# Patient Record
Sex: Female | Born: 1985
Health system: Southern US, Community
[De-identification: ages and names within clinical notes are randomized; demographics above are authoritative.]

## PROBLEM LIST (undated history)

## (undated) DIAGNOSIS — R55 Syncope and collapse: Secondary | ICD-10-CM

## (undated) DIAGNOSIS — D500 Iron deficiency anemia secondary to blood loss (chronic): Secondary | ICD-10-CM

## (undated) DIAGNOSIS — D573 Sickle-cell trait: Secondary | ICD-10-CM

## (undated) DIAGNOSIS — K581 Irritable bowel syndrome with constipation: Secondary | ICD-10-CM

## (undated) DIAGNOSIS — F419 Anxiety disorder, unspecified: Secondary | ICD-10-CM

## (undated) DIAGNOSIS — J302 Other seasonal allergic rhinitis: Secondary | ICD-10-CM

## (undated) DIAGNOSIS — G43109 Migraine with aura, not intractable, without status migrainosus: Secondary | ICD-10-CM

## (undated) DIAGNOSIS — Z973 Presence of spectacles and contact lenses: Secondary | ICD-10-CM

## (undated) DIAGNOSIS — N920 Excessive and frequent menstruation with regular cycle: Secondary | ICD-10-CM

## (undated) DIAGNOSIS — E559 Vitamin D deficiency, unspecified: Secondary | ICD-10-CM

## (undated) DIAGNOSIS — Z8669 Personal history of other diseases of the nervous system and sense organs: Secondary | ICD-10-CM

## (undated) DIAGNOSIS — Z8619 Personal history of other infectious and parasitic diseases: Secondary | ICD-10-CM

## (undated) HISTORY — DX: Personal history of other infectious and parasitic diseases: Z86.19

## (undated) HISTORY — DX: Syncope and collapse: R55

## (undated) HISTORY — DX: Personal history of other diseases of the nervous system and sense organs: Z86.69

---

## 2010-11-02 ENCOUNTER — Inpatient Hospital Stay (INDEPENDENT_AMBULATORY_CARE_PROVIDER_SITE_OTHER)
Admission: RE | Admit: 2010-11-02 | Discharge: 2010-11-02 | Disposition: A | Discharge status: Home / Self Care | Payer: Medicaid Other | Source: Ambulatory Visit

## 2010-11-02 DIAGNOSIS — H60399 Other infective otitis externa, unspecified ear: Secondary | ICD-10-CM

## 2011-10-24 ENCOUNTER — Encounter (HOSPITAL_COMMUNITY): Payer: Self-pay | Admitting: Emergency Medicine

## 2011-10-24 ENCOUNTER — Emergency Department (INDEPENDENT_AMBULATORY_CARE_PROVIDER_SITE_OTHER)
Admission: EM | Admit: 2011-10-24 | Discharge: 2011-10-24 | Disposition: A | Discharge status: Home / Self Care | Payer: Self-pay | Source: Home / Self Care | Attending: Emergency Medicine | Admitting: Emergency Medicine

## 2011-10-24 DIAGNOSIS — M62838 Other muscle spasm: Secondary | ICD-10-CM

## 2011-10-24 HISTORY — DX: Sickle-cell trait: D57.3

## 2011-10-24 MED ORDER — METAXALONE 800 MG PO TABS: 800.0000 mg | ORAL_TABLET | Freq: Three times a day (TID) | ORAL | Status: AC

## 2011-10-24 MED ORDER — HYDROCODONE-ACETAMINOPHEN 5-325 MG PO TABS: 2.0000 | ORAL_TABLET | ORAL | Status: AC | PRN

## 2011-10-24 MED ORDER — NAPROXEN 500 MG PO TABS: 500.0000 mg | ORAL_TABLET | Freq: Two times a day (BID) | ORAL | Status: DC
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
## 2011-10-24 NOTE — ED Notes (Signed)
Denies any numbness or tingling.

## 2011-10-24 NOTE — Discharge Instructions (Signed)
It can take 4-6 weeks to fully recover from something like this.Take the medication as written. Take 1 gram of tylenol  up to 4 times a day as needed for pain and fever. This with the naprosayn is an effective combination for pain. Take the hydrocodone/norco  only for severe pain. Do not take the tylenol and hydrocodone/norco as they both have tylenol in them and too much can hurt your liver. Return if you get worse, have a fever >100.4, or for any concerns.

## 2012-05-19 ENCOUNTER — Other Ambulatory Visit (HOSPITAL_COMMUNITY)
Admission: RE | Admit: 2012-05-19 | Discharge: 2012-05-19 | Disposition: A | Discharge status: Home / Self Care | Payer: Private Health Insurance - Indemnity | Source: Ambulatory Visit | Attending: Obstetrics and Gynecology | Admitting: Obstetrics and Gynecology

## 2012-05-19 DIAGNOSIS — Z01419 Encounter for gynecological examination (general) (routine) without abnormal findings: Secondary | ICD-10-CM | POA: Insufficient documentation

## 2012-05-19 DIAGNOSIS — N760 Acute vaginitis: Secondary | ICD-10-CM | POA: Insufficient documentation

## 2012-09-11 ENCOUNTER — Encounter: Payer: Self-pay | Admitting: Obstetrics & Gynecology

## 2012-09-11 ENCOUNTER — Other Ambulatory Visit: Payer: Self-pay | Admitting: Obstetrics & Gynecology

## 2012-09-11 ENCOUNTER — Ambulatory Visit (INDEPENDENT_AMBULATORY_CARE_PROVIDER_SITE_OTHER): Payer: BC Managed Care – PPO | Admitting: Obstetrics & Gynecology

## 2012-09-11 VITALS — BP 125/66 | HR 87 | Ht 66.0 in | Wt 136.0 lb

## 2012-09-11 DIAGNOSIS — Z124 Encounter for screening for malignant neoplasm of cervix: Secondary | ICD-10-CM

## 2012-09-11 DIAGNOSIS — Z01812 Encounter for preprocedural laboratory examination: Secondary | ICD-10-CM

## 2012-09-11 DIAGNOSIS — Z01419 Encounter for gynecological examination (general) (routine) without abnormal findings: Secondary | ICD-10-CM

## 2012-09-11 DIAGNOSIS — Z1151 Encounter for screening for human papillomavirus (HPV): Secondary | ICD-10-CM

## 2012-09-11 DIAGNOSIS — Z0000 Encounter for general adult medical examination without abnormal findings: Secondary | ICD-10-CM

## 2012-09-11 DIAGNOSIS — N898 Other specified noninflammatory disorders of vagina: Secondary | ICD-10-CM

## 2012-09-11 DIAGNOSIS — Z3049 Encounter for surveillance of other contraceptives: Secondary | ICD-10-CM

## 2012-09-11 DIAGNOSIS — Z309 Encounter for contraceptive management, unspecified: Secondary | ICD-10-CM

## 2012-09-11 LAB — POCT URINE PREGNANCY: Preg Test, Ur: NEGATIVE

## 2012-09-11 MED ORDER — MEDROXYPROGESTERONE ACETATE 150 MG/ML IM SUSP
150.0000 mg | INTRAMUSCULAR | Status: DC
  Administered 2012-09-11: 150 mg via INTRAMUSCULAR
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
## 2012-09-14 LAB — WET PREP, GENITAL
Trich, Wet Prep: NONE SEEN
Yeast Wet Prep HPF POC: NONE SEEN

## 2012-12-10 ENCOUNTER — Ambulatory Visit: Payer: BC Managed Care – PPO

## 2012-12-11 ENCOUNTER — Ambulatory Visit (INDEPENDENT_AMBULATORY_CARE_PROVIDER_SITE_OTHER): Payer: BC Managed Care – PPO | Admitting: *Deleted

## 2012-12-11 DIAGNOSIS — Z3042 Encounter for surveillance of injectable contraceptive: Secondary | ICD-10-CM

## 2012-12-11 DIAGNOSIS — Z3049 Encounter for surveillance of other contraceptives: Secondary | ICD-10-CM

## 2012-12-11 MED ORDER — MEDROXYPROGESTERONE ACETATE 150 MG/ML IM SUSP
150.0000 mg | Freq: Once | INTRAMUSCULAR | Status: AC
  Administered 2012-12-11: 150 mg via INTRAMUSCULAR

## 2012-12-11 MED ORDER — MEDROXYPROGESTERONE ACETATE 150 MG/ML IM SUSP: 150.0000 mg | INTRAMUSCULAR | Status: DC

## 2012-12-11 NOTE — Progress Notes (Signed)
Patient is here today for Depo provera 150mg .  It is administered in the Right upper outer quadrant of her buttock.  She is tolerating the injection well.  I have called it into the pharmacy for her for her next administration.  She will also sign up for My Chart for reminders.

## 2013-03-11 ENCOUNTER — Ambulatory Visit: Payer: BC Managed Care – PPO

## 2013-03-30 ENCOUNTER — Encounter: Payer: Self-pay | Admitting: Obstetrics & Gynecology

## 2013-03-30 ENCOUNTER — Ambulatory Visit (INDEPENDENT_AMBULATORY_CARE_PROVIDER_SITE_OTHER): Payer: BC Managed Care – PPO | Admitting: Obstetrics & Gynecology

## 2013-03-30 VITALS — BP 108/71 | HR 94 | Ht 65.0 in | Wt 126.0 lb

## 2013-03-30 DIAGNOSIS — N898 Other specified noninflammatory disorders of vagina: Secondary | ICD-10-CM

## 2013-03-30 DIAGNOSIS — N760 Acute vaginitis: Secondary | ICD-10-CM

## 2013-03-30 DIAGNOSIS — B9689 Other specified bacterial agents as the cause of diseases classified elsewhere: Secondary | ICD-10-CM

## 2013-03-30 DIAGNOSIS — A499 Bacterial infection, unspecified: Secondary | ICD-10-CM

## 2013-03-30 NOTE — Progress Notes (Signed)
GYNECOLOGY CLINIC PROGRESS NOTE  History:  27 y.o. [id number] here today for evaluation of abnormal discharge x 2 days.  Discharge is described as light brown, "almost like end of period discharge" and watery.  There is some pruritus associated with this.  She denies having any recent changes in vulvar hygiene; no recent unusual sexual activity.  She is on Depo Provera, last injection was 2 weeks ago. She reports being amenorrheic for three years.  No other GYN concerns.  The following portions of the patient's history were reviewed and updated as appropriate: allergies, current medications, past family history, past medical history, past social history, past surgical history and problem list. Last pap on 09/11/12 was normal and negative for HRHPV.  Review of Systems:  Pertinent items are noted in HPI.  Objective:  Physical Exam BP 108/71  Pulse 94  Ht 5\' 5"  (1.651 m)  Wt 126 lb (57.153 kg)  BMI 20.97 kg/m2 Gen: NAD Abd: Soft, nontender and nondistended Pelvic: Normal appearing external genitalia; normal appearing vaginal mucosa and cervix.  Small amount of light brown vaginal discharge, wet prep sample obtained.  Small uterus, no other palpable masses, no uterine or adnexal tenderness  Assessment & Plan:  Likely scant breakthrough spotting on Depo Provera, patient assured that this is normal and can occur sporadically. However, will follow up wet prep and manage accordingly.  Proper vulvar hygiene practices emphasized.

## 2013-03-30 NOTE — Patient Instructions (Signed)
Vaginitis Vaginitis is an inflammation of the vagina. It is most often caused by a change in the normal balance of the bacteria and yeast that live in the vagina. This change in balance causes an overgrowth of certain bacteria or yeast, which causes the inflammation. There are different types of vaginitis, but the most common types are:  Bacterial vaginosis.  Yeast infection (candidiasis).  Trichomoniasis vaginitis. This is a sexually transmitted infection (STI).  Viral vaginitis.  Atropic vaginitis.  Allergic vaginitis. CAUSES  The cause depends on the type of vaginitis. Vaginitis can be caused by:  Bacteria (bacterial vaginosis).  Yeast (yeast infection).  A parasite (trichomoniasis vaginitis)  A virus (viral vaginitis).  Low hormone levels (atrophic vaginitis). Low hormone levels can occur during pregnancy, breastfeeding, or after menopause.  Irritants, such as bubble baths, scented tampons, and feminine sprays (allergic vaginitis). Other factors can change the normal balance of the yeast and bacteria that live in the vagina. These include:  Antibiotic medicines.  Poor hygiene.  Diaphragms, vaginal sponges, spermicides, birth control pills, and intrauterine devices (IUD).  Sexual intercourse.  Infection.  Uncontrolled diabetes.  A weakened immune system. SYMPTOMS  Symptoms can vary depending on the cause of the vaginitis. Common symptoms include:  Abnormal vaginal discharge.  The discharge is white, gray, or yellow with bacterial vaginosis.  The discharge is thick, white, and cheesy with a yeast infection.  The discharge is frothy and yellow or greenish with trichomoniasis.  A bad vaginal odor.  The odor is fishy with bacterial vaginosis.  Vaginal itching, pain, or swelling.  Painful intercourse.  Pain or burning when urinating. Sometimes, there are no symptoms. TREATMENT  Treatment will vary depending on the type of infection.   Bacterial  vaginosis and trichomoniasis are often treated with antibiotic creams or pills.  Yeast infections are often treated with antifungal medicines, such as vaginal creams or suppositories.  Viral vaginitis has no cure, but symptoms can be treated with medicines that relieve discomfort. Your sexual partner should be treated as well.  Atrophic vaginitis may be treated with an estrogen cream, pill, suppository, or vaginal ring. If vaginal dryness occurs, lubricants and moisturizing creams may help. You may be told to avoid scented soaps, sprays, or douches.  Allergic vaginitis treatment involves quitting the use of the product that is causing the problem. Vaginal creams can be used to treat the symptoms. HOME CARE INSTRUCTIONS   Take all medicines as directed by your caregiver.  Keep your genital area clean and dry. Avoid soap and only rinse the area with water.  Avoid douching. It can remove the healthy bacteria in the vagina.  Do not use tampons or have sexual intercourse until your vaginitis has been treated. Use sanitary pads while you have vaginitis.  Wipe from front to back. This avoids the spread of bacteria from the rectum to the vagina.  Let air reach your genital area.  Wear cotton underwear to decrease moisture buildup.  Avoid wearing underwear while you sleep until your vaginitis is gone.  Avoid tight pants and underwear or nylons without a cotton panel.  Take off wet clothing (especially bathing suits) as soon as possible.  Use mild, non-scented products. Avoid using irritants, such as:  Scented feminine sprays.  Fabric softeners.  Scented detergents.  Scented tampons.  Scented soaps or bubble baths.  Practice safe sex and use condoms. Condoms may prevent the spread of trichomoniasis and viral vaginitis. SEEK MEDICAL CARE IF:   You have abdominal pain.  You   have a fever or persistent symptoms for more than 2 3 days.  You have a fever and your symptoms suddenly  get worse. Document Released: 06/16/2007 Document Revised: 05/13/2012 Document Reviewed: 01/30/2012 Exit[employer] Patient Information 2014 [employer].  

## 2013-03-31 ENCOUNTER — Encounter: Payer: Self-pay | Admitting: Obstetrics & Gynecology

## 2013-03-31 LAB — WET PREP, GENITAL
Clue Cells Wet Prep HPF POC: NONE SEEN
Yeast Wet Prep HPF POC: NONE SEEN

## 2013-03-31 MED ORDER — TINIDAZOLE 500 MG PO TABS: 2.0000 g | ORAL_TABLET | Freq: Every day | ORAL | Status: DC

## 2013-03-31 NOTE — Addendum Note (Signed)
Addended by: Jaynie Collins A on: 03/31/2013 09:28 AM   Modules accepted: Orders

## 2013-04-20 ENCOUNTER — Encounter (HOSPITAL_COMMUNITY): Payer: Self-pay | Admitting: *Deleted

## 2013-04-20 ENCOUNTER — Emergency Department (INDEPENDENT_AMBULATORY_CARE_PROVIDER_SITE_OTHER)
Admission: EM | Admit: 2013-04-20 | Discharge: 2013-04-20 | Disposition: A | Discharge status: Home / Self Care | Payer: BC Managed Care – PPO | Source: Home / Self Care | Attending: Family Medicine | Admitting: Family Medicine

## 2013-04-20 ENCOUNTER — Emergency Department (INDEPENDENT_AMBULATORY_CARE_PROVIDER_SITE_OTHER): Payer: BC Managed Care – PPO

## 2013-04-20 DIAGNOSIS — R0789 Other chest pain: Secondary | ICD-10-CM

## 2013-04-20 MED ORDER — DICLOFENAC POTASSIUM 50 MG PO TABS: 50.0000 mg | ORAL_TABLET | Freq: Three times a day (TID) | ORAL | Status: DC

## 2013-04-20 NOTE — ED Notes (Signed)
Pt  Reports  She  Developed  r  Upper  Chest  Wall pain   Worse  When  She takes  A  Deep  Breath and  On  Palpation as  Well        Sitting  Upright on  Exam table  Speaking in  Complete  sentances   No PE  Risk  Factors

## 2013-04-20 NOTE — ED Provider Notes (Addendum)
CSN: [id number]     Arrival date & time 04/20/13  1031 History     None    Chief Complaint  Patient presents with  . Chest Pain   (Consider location/radiation/quality/duration/timing/severity/associated sxs/prior Treatment) Patient is a 27 y.o. female presenting with chest pain. The history is provided by the patient.  Chest Pain Pain location:  R chest Pain quality: sharp   Pain radiates to:  Does not radiate Pain radiates to the back: no   Pain severity:  Mild Onset quality:  Sudden Progression:  Unchanged Chronicity:  New Context: movement and raising an arm   Relieved by:  None tried Worsened by:  Nothing tried Associated symptoms: no palpitations and no shortness of breath     Past Medical History  Diagnosis Date  . Asthma   . Sickle cell trait    History reviewed. No pertinent past surgical history. Family History  Problem Relation Age of Onset  . Hypertension Mother   . Asthma Mother   . Hypertension Father   . Asthma Father   . Asthma Son   . Hypertension Maternal Grandmother   . Hypertension Maternal Grandfather   . Hypertension Paternal Grandmother   . Hypertension Paternal Grandfather    History  Substance Use Topics  . Smoking status: Never Smoker   . Smokeless tobacco: Never Used  . Alcohol Use: No   OB History   Grav Para Term Preterm Abortions TAB SAB Ect Mult Living   3 1 1  0 2 1 1  0 0 1     Review of Systems  Constitutional: Negative.   Respiratory: Negative for chest tightness, shortness of breath and wheezing.   Cardiovascular: Positive for chest pain. Negative for palpitations and leg swelling.  Gastrointestinal: Negative.     Allergies  Review of patient's allergies indicates no known allergies.  Home Medications   Current Outpatient Rx  Name  Route  Sig  Dispense  Refill  . diclofenac (CATAFLAM) 50 MG tablet   Oral   Take 1 tablet (50 mg total) by mouth 3 (three) times daily.   21 tablet   0   . medroxyPROGESTERone  (DEPO-PROVERA) 150 MG/ML injection   Intramuscular   Inject 1 mL (150 mg total) into the muscle every 3 (three) months.   1 mL   5   . tinidazole (TINDAMAX) 500 MG tablet   Oral   Take 4 tablets (2,000 mg total) by mouth daily with breakfast. For two days   8 tablet   2    BP 119/91  Pulse 93  Temp(Src) 97.7 F (36.5 C) (Oral)  Resp 16  SpO2 100% Physical Exam  Nursing note and vitals reviewed. Constitutional: She is oriented to person, place, and time. She appears well-developed and well-nourished.  HENT:  Mouth/Throat: Oropharynx is clear and moist.  Neck: Normal range of motion. Neck supple.  Cardiovascular: Normal rate, regular rhythm, normal heart sounds and intact distal pulses.   Pulmonary/Chest: Effort normal and breath sounds normal. She exhibits tenderness.  Abdominal: Soft. Bowel sounds are normal.  Musculoskeletal: She exhibits no edema.  Lymphadenopathy:    She has no cervical adenopathy.  Neurological: She is alert and oriented to person, place, and time.  Skin: Skin is warm and dry.    ED Course   Procedures (including critical care time)  Labs Reviewed - No data to display Dg Chest 2 View  04/20/2013   *RADIOLOGY REPORT*  Clinical Data: Chest pain.  CHEST - 2 VIEW  Comparison: None.  Findings: Trachea is midline.  Heart size normal.  Lungs are clear. No pleural fluid.  IMPRESSION: Negative.   Original Report Authenticated By: Leanna Battles, M.D.   1. Musculoskeletal chest pain     MDM  X-rays reviewed and report per radiologist.   Linna Hoff, MD 04/20/13 1124  Linna Hoff, MD 04/20/13 [phone]

## 2013-07-05 ENCOUNTER — Ambulatory Visit (INDEPENDENT_AMBULATORY_CARE_PROVIDER_SITE_OTHER): Payer: BC Managed Care – PPO | Admitting: Family Medicine

## 2013-07-05 ENCOUNTER — Encounter: Payer: Self-pay | Admitting: Family Medicine

## 2013-07-05 VITALS — BP 102/64 | HR 96 | Temp 98.2°F | Ht 65.25 in | Wt 118.8 lb

## 2013-07-05 DIAGNOSIS — G43909 Migraine, unspecified, not intractable, without status migrainosus: Secondary | ICD-10-CM

## 2013-07-05 DIAGNOSIS — D573 Sickle-cell trait: Secondary | ICD-10-CM

## 2013-07-05 DIAGNOSIS — D649 Anemia, unspecified: Secondary | ICD-10-CM

## 2013-07-05 DIAGNOSIS — Z8701 Personal history of pneumonia (recurrent): Secondary | ICD-10-CM

## 2013-07-05 DIAGNOSIS — G4700 Insomnia, unspecified: Secondary | ICD-10-CM

## 2013-07-05 NOTE — Assessment & Plan Note (Signed)
Recommend pneumovax Pt wants to put this off for a week or 2- will f/u for nurse appt or get at [facility] [employer] where she works

## 2013-07-05 NOTE — Progress Notes (Signed)
Subjective:    Patient ID: Anna Kidd, female    DOB: 1986/04/22, 27 y.o.   MRN: [id number]  HPI Here to establish for regular care   Pt of Dr Marice Potter (gyn) Last visit 1/14 with a nl pap She gets depo provera injections -and does not have menses with it   imms Td-? 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 What her baseline hemoglobin  Was on iron in the past - most of her life- not now - and she hates it   Hx of migraines-ongoing problem - for about 3 years  ? If corresponds with depo provera - it could ? She gets a headache at least twice per week  Works through it - she has pretty bad pain (wants to lie down in a dark room) No n/v , pos for sensitivity to light/ sound and smell  No otc meds work  occ goes to sleep and then wakes up with the same headache - can be 2 hours to 2 days  Just had her depo shot in oct- due in jan  Migraines do not run in the family  2 caff soda per day  5 hours of sleep per night - occ wakes up tired  She is a very light sleeper   She is sexually active   Has fainted in the past- when she was pregnant -- (got so anemic she needed blood tranfusions)   Has had pneumonia in 2004- was hospitalized 1 1/2 months - it was severe  Had to have blood transfusions ICU for a week before she went upstairs  Does have asthma now -since then  Not a smoker   Has not had vaccine for pneumonia   Does not drink or smoke  Has one child- son is almost 7 Is single   There are no active problems to display for this patient.  Past Medical History  Diagnosis Date  .  Asthma   . Sickle cell trait   . History of chicken pox   . Fainting spell     only when pregnant  . History of migraine    No past surgical history on file. History  Substance Use Topics  . Smoking status: Never Smoker   . Smokeless tobacco: Never Used  . Alcohol Use: No   Family History  Problem Relation Age of Onset  . Hypertension Mother   . Asthma Mother   . Hypertension Father   . Asthma Father   . Asthma Son   . Hypertension Maternal Grandmother   . Hypertension Maternal Grandfather   . Hypertension Paternal Grandmother   . Hypertension Paternal Grandfather   . Diabetes Paternal Grandmother    No Known Allergies Current Outpatient Prescriptions on File Prior to Visit  Medication Sig Dispense Refill  . medroxyPROGESTERone (DEPO-PROVERA) 150 MG/ML injection Inject 1 mL (150 mg total) into the muscle every 3 (three) months.  1 mL  5   No current facility-administered medications on file prior  to visit.     Review of Systems    Review of Systems  Constitutional: Negative for fever, appetite change, and unexpected weight change. pos for occasional fatigue and poor sleep Eyes: Negative for pain and visual disturbance.  Respiratory: Negative for cough and shortness of breath.   Cardiovascular: Negative for cp or palpitations    Gastrointestinal: Negative for nausea, diarrhea and constipation.  Genitourinary: Negative for urgency and frequency.  Skin: Negative for pallor or rash   Neurological: Negative for weakness, light-headedness, numbness and pos for headaches  Hematological: Negative for adenopathy. Does not bruise/bleed easily.  Psychiatric/Behavioral: Negative for dysphoric mood. The patient is not nervous/anxious.      Objective:   Physical Exam  Constitutional: She appears well-developed and well-nourished. No distress.  Slim and well appearing  HENT:  Head: Normocephalic and atraumatic.  Right Ear: External ear normal.  Left Ear: External ear normal.   Nose: Nose normal.  Mouth/Throat: Oropharynx is clear and moist.  Eyes: Conjunctivae and EOM are normal. Pupils are equal, round, and reactive to light. Right eye exhibits no discharge. Left eye exhibits no discharge. No scleral icterus.  Neck: Normal range of motion. Neck supple. No JVD present. Carotid bruit is not present. No thyromegaly present.  Cardiovascular: Normal rate, regular rhythm, normal heart sounds and intact distal pulses.  Exam reveals no gallop.   No murmur heard. Pulmonary/Chest: Effort normal and breath sounds normal. No respiratory distress. She has no wheezes.  Abdominal: Soft. Bowel sounds are normal. She exhibits no distension, no abdominal bruit and no mass. There is no tenderness.  Musculoskeletal: She exhibits no edema and no tenderness.  Lymphadenopathy:    She has no cervical adenopathy.  Neurological: She is alert. She has normal reflexes. She displays no atrophy and no tremor. No cranial nerve deficit or sensory deficit. She exhibits normal muscle tone. Coordination and gait normal.  Skin: Skin is warm and dry. No rash noted. No erythema. No pallor.  Psychiatric: She has a normal mood and affect.          Assessment & Plan:

## 2013-07-05 NOTE — Assessment & Plan Note (Signed)
Suspect that this may be a side eff of her depo provera She will disc this with her gyn physician when due for next dose Rev migraine prevention= given handout Disc lifestyle/sleephabits/ caffeine/ stressors and hydration (also avoidance of analgesic rebound) If no imp off depo- may consider ha prophylaxis

## 2013-07-05 NOTE — Assessment & Plan Note (Signed)
Cbc today Hx of anemia Not currently on iron

## 2013-07-05 NOTE — Assessment & Plan Note (Signed)
Disc sleep hygiene and need for better sleep in light of migraine hx tsh today

## 2013-07-05 NOTE — Patient Instructions (Signed)
Check into the HPV vaccine coverage with your insurance - and let us know if you want to start the series You need tetanus shot (Tdap) and also a pneumococcal vaccine  Labs today for thyroid function and also blood count (for anemia )  Try to eat a healthy diet/ more water (and try to cut out caffeine) Also try to get more sleep  Ask Dr Marice Potter before your next depo shot re: is the depo causing your migraines - you may need a trial without it just so see

## 2013-07-05 NOTE — Assessment & Plan Note (Signed)
Lab today Hx of sickle cell trait  Used to be on iron

## 2013-07-06 LAB — CBC WITH DIFFERENTIAL/PLATELET
Basophils Absolute: 0 10*3/uL (ref 0.0–0.1)
Basophils Relative: 0.3 % (ref 0.0–3.0)
Eosinophils Absolute: 0.2 10*3/uL (ref 0.0–0.7)
Eosinophils Relative: 3.2 % (ref 0.0–5.0)
HCT: 35.9 % — ABNORMAL LOW (ref 36.0–46.0)
Hemoglobin: 11.6 g/dL — ABNORMAL LOW (ref 12.0–15.0)
Lymphocytes Relative: 49.3 % — ABNORMAL HIGH (ref 12.0–46.0)
Lymphs Abs: 3 10*3/uL (ref 0.7–4.0)
MCHC: 32.5 g/dL (ref 30.0–36.0)
MCV: 71.6 fl — ABNORMAL LOW (ref 78.0–100.0)
Monocytes Absolute: 0.4 10*3/uL (ref 0.1–1.0)
Monocytes Relative: 6.2 % (ref 3.0–12.0)
Neutro Abs: 2.5 10*3/uL (ref 1.4–7.7)
Neutrophils Relative %: 41 % — ABNORMAL LOW (ref 43.0–77.0)
Platelets: 253 10*3/uL (ref 150.0–400.0)
RBC: 5.01 Mil/uL (ref 3.87–5.11)
RDW: 14.2 % (ref 11.5–14.6)
WBC: 6.1 10*3/uL (ref 4.5–10.5)

## 2013-07-06 LAB — TSH: TSH: 0.74 u[IU]/mL (ref 0.35–5.50)

## 2013-07-06 LAB — FERRITIN: Ferritin: 28.9 ng/mL (ref 10.0–291.0)

## 2013-08-19 ENCOUNTER — Emergency Department (INDEPENDENT_AMBULATORY_CARE_PROVIDER_SITE_OTHER)
Admission: EM | Admit: 2013-08-19 | Discharge: 2013-08-19 | Disposition: A | Discharge status: Home / Self Care | Payer: BC Managed Care – PPO | Source: Home / Self Care

## 2013-08-19 ENCOUNTER — Encounter (HOSPITAL_COMMUNITY): Payer: Self-pay | Admitting: Emergency Medicine

## 2013-08-19 DIAGNOSIS — M940 Chondrocostal junction syndrome [Tietze]: Secondary | ICD-10-CM

## 2013-08-19 NOTE — ED Notes (Signed)
Pt c/o constant chest pain onset 0400... Pain woke her up and she started to vomit Pain increases when she lays on left side or when she takes deep breaths... sxs also include feeling weak Denies: f/d, SOB, wheezing.... Reports she was lifting heavy boxes at work ranging 15-25lbs  She is alert and talking in complete sentences w/no signs of acute distress.

## 2013-08-19 NOTE — ED Provider Notes (Signed)
CSN: [id number]     Arrival date & time 08/19/13  1331 History   First MD Initiated Contact with Patient 08/19/13 1404     Chief Complaint  Patient presents with  . Chest Pain   (Consider location/radiation/quality/duration/timing/severity/associated sxs/prior Treatment) HPI Comments: 27 year old female presents with left parasternal chest pain since early this morning. Pain is located at the left sternal border. It is described as a sharp  ephemoral pain. She states that 2 days ago she was unloading a truck and performing a lot of lifting maneuvers. It also hurts to take a deep breath and sometimes with movement. Denies shortness of breath, cough.   Past Medical History  Diagnosis Date  . Asthma   . Sickle cell trait   . History of chicken pox   . Fainting spell     only when pregnant  . History of migraine    History reviewed. No pertinent past surgical history. Family History  Problem Relation Age of Onset  . Hypertension Mother   . Asthma Mother   . Hypertension Father   . Asthma Father   . Asthma Son   . Hypertension Maternal Grandmother   . Hypertension Maternal Grandfather   . Hypertension Paternal Grandmother   . Hypertension Paternal Grandfather   . Diabetes Paternal Grandmother    History  Substance Use Topics  . Smoking status: Never Smoker   . Smokeless tobacco: Never Used  . Alcohol Use: No   OB History   Grav Para Term Preterm Abortions TAB SAB Ect Mult Living   3 1 1  0 2 1 1  0 0 1     Review of Systems  Constitutional: Negative for fever, chills and activity change.  HENT: Negative.   Respiratory: Negative.  Negative for cough, choking, chest tightness, shortness of breath and wheezing.   Cardiovascular: Positive for chest pain.  Gastrointestinal: Negative.   Musculoskeletal:       As per HPI  Skin: Negative for color change, pallor and rash.  Neurological: Negative.     Allergies  Review of patient's allergies indicates no known  allergies.  Home Medications   Current Outpatient Rx  Name  Route  Sig  Dispense  Refill  . medroxyPROGESTERone (DEPO-PROVERA) 150 MG/ML injection   Intramuscular   Inject 1 mL (150 mg total) into the muscle every 3 (three) months.   1 mL   5    BP 107/81  Pulse 85  Temp(Src) 97.1 F (36.2 C) (Oral)  Resp 18  SpO2 100% Physical Exam  Nursing note and vitals reviewed. Constitutional: She is oriented to person, place, and time. She appears well-developed and well-nourished. No distress.  HENT:  Head: Normocephalic and atraumatic.  Eyes: Conjunctivae and EOM are normal.  Neck: Normal range of motion. Neck supple.  Cardiovascular: Normal rate, regular rhythm, normal heart sounds and intact distal pulses.   No murmur heard. Pulmonary/Chest: Effort normal and breath sounds normal. No respiratory distress. She has no wheezes. She has no rales.  Abdominal: Soft. There is no tenderness.  Musculoskeletal: She exhibits no edema.  Neurological: She is alert and oriented to person, place, and time. No cranial nerve deficit.  Skin: Skin is warm and dry.  Psychiatric: She has a normal mood and affect.    ED Course  Procedures (including critical care time) Labs Review Labs Reviewed - No data to display Imaging Review No results found.      MDM   1. Costochondritis, acute  NSAIDS   Ibuprofen 600mg  q 6h prn  Ice Instrucitons for Dx. Limit activity that produces pain  For cough or dyspnea rechk.  Hayden Rasmussen, NP 08/19/13 [id number]

## 2013-08-21 NOTE — ED Provider Notes (Signed)
Medical screening examination/treatment/procedure(s) were performed by a resident physician or non-physician practitioner and as the supervising physician I was immediately available for consultation/collaboration.  Clementeen Graham, MD    Rodolph Bong, MD 08/21/13 [phone]

## 2013-09-14 ENCOUNTER — Encounter: Payer: Self-pay | Admitting: Obstetrics & Gynecology

## 2013-09-14 ENCOUNTER — Ambulatory Visit (INDEPENDENT_AMBULATORY_CARE_PROVIDER_SITE_OTHER): Payer: BC Managed Care – PPO | Admitting: Obstetrics & Gynecology

## 2013-09-14 VITALS — BP 107/69 | HR 88 | Ht 65.0 in | Wt 122.0 lb

## 2013-09-14 DIAGNOSIS — Z309 Encounter for contraceptive management, unspecified: Secondary | ICD-10-CM

## 2013-09-14 DIAGNOSIS — G43909 Migraine, unspecified, not intractable, without status migrainosus: Secondary | ICD-10-CM

## 2013-09-14 MED ORDER — SUMATRIPTAN SUCCINATE 100 MG PO TABS: 100.0000 mg | ORAL_TABLET | Freq: Once | ORAL | Status: DC | PRN

## 2013-09-14 NOTE — Patient Instructions (Signed)
Migraine Headache A migraine headache is an intense, throbbing pain on one or both sides of your head. A migraine can last for 30 minutes to several hours. CAUSES  The exact cause of a migraine headache is not always known. However, a migraine may be caused when nerves in the brain become irritated and release chemicals that cause inflammation. This causes pain. Certain things may also trigger migraines, such as:  Alcohol.  Smoking.  Stress.  Menstruation.  Aged cheeses.  Foods or drinks that contain nitrates, glutamate, aspartame, or tyramine.  Lack of sleep.  Chocolate.  Caffeine.  Hunger.  Physical exertion.  Fatigue.  Medicines used to treat chest pain (nitroglycerine), birth control pills, estrogen, and some blood pressure medicines. SIGNS AND SYMPTOMS  Pain on one or both sides of your head.  Pulsating or throbbing pain.  Severe pain that prevents daily activities.  Pain that is aggravated by any physical activity.  Nausea, vomiting, or both.  Dizziness.  Pain with exposure to bright lights, loud noises, or activity.  General sensitivity to bright lights, loud noises, or smells. Before you get a migraine, you may get warning signs that a migraine is coming (aura). An aura may include:  Seeing flashing lights.  Seeing bright spots, halos, or zig-zag lines.  Having tunnel vision or blurred vision.  Having feelings of numbness or tingling.  Having trouble talking.  Having muscle weakness. DIAGNOSIS  A migraine headache is often diagnosed based on:  Symptoms.  Physical exam.  A CT scan or MRI of your head. These imaging tests cannot diagnose migraines, but they can help rule out other causes of headaches. TREATMENT Medicines may be given for pain and nausea. Medicines can also be given to help prevent recurrent migraines.  HOME CARE INSTRUCTIONS  Only take over-the-counter or prescription medicines for pain or discomfort as directed by your  health care provider. The use of long-term narcotics is not recommended.  Lie down in a dark, quiet room when you have a migraine.  Keep a journal to find out what may trigger your migraine headaches. For example, write down:  What you eat and drink.  How much sleep you get.  Any change to your diet or medicines.  Limit alcohol consumption.  Quit smoking if you smoke.  Get 7 9 hours of sleep, or as recommended by your health care provider.  Limit stress.  Keep lights dim if bright lights bother you and make your migraines worse. SEEK IMMEDIATE MEDICAL CARE IF:   Your migraine becomes severe.  You have a fever.  You have a stiff neck.  You have vision loss.  You have muscular weakness or loss of muscle control.  You start losing your balance or have trouble walking.  You feel faint or pass out.  You have severe symptoms that are different from your first symptoms. MAKE SURE YOU:   Understand these instructions.  Will watch your condition.  Will get help right away if you are not doing well or get worse. Document Released: 08/19/2005 Document Revised: 06/09/2013 Document Reviewed: 04/26/2013 [employer] Patient Information 2014 [employer].  

## 2013-09-14 NOTE — Progress Notes (Signed)
   CLINIC ENCOUNTER NOTE  History:  28 y.o. [id number] here today for discussion of contraception. She is on Depo Provera but gets severe migraines about 3 times a week.  She had exacerbation of her migraines with pregnancy and believes the migraines are hormone-related.  Last dose was 06/16/13, she declines any further doses for now and wants to discuss other options.  No other concerns.  The following portions of the patient's history were reviewed and updated as appropriate: allergies, current medications, past family history, past medical history, past social history, past surgical history and problem list.  Normal pap and negative HPV in 09/11/12.  Review of Systems:  Pertinent items are noted in HPI.  Objective:   BP 107/69  Pulse 88  Ht 5\' 5"  (1.651 m)  Wt 122 lb (55.339 kg)  BMI 20.30 kg/m2 Physical Exam deferred  Assessment & Plan:  Reviewed all forms of birth control options available including abstinence; over the counter/barrier methods; hormonal contraceptive medication including pill, patch, ring, Depo-Provera injection, Nexplanon; Mirena, Skyla and Paragard IUDs.   Risks and benefits reviewed.  Questions were answered.   Patient wants to avoid periods as she has heavy bleeding requiring transfusions in the past; she was told Paragard was not a good choice but she can consider continuous OCPs, Nuvaring or Mirena.  She wants to avoid all hormones for now, will try condoms and consider Mirena IUD. Bleeding precautions reviewed.   Sumatriptan pills also prescribed for patient; this was done after consultation with Jannifer RodneyLinda Barefoot, NP, Headache Specialist.  Patient was told to return for IUD insertion or call back if she decides on another modality.  She was also encouraged to meet with Jannifer RodneyLinda Barefoot, NP for further evaluation of her migraines.    Jaynie CollinsUGONNA  Tanish Prien, MD, FACOG Attending Obstetrician & Gynecologist Faculty Practice, [facility]

## 2013-12-20 ENCOUNTER — Encounter: Payer: Self-pay | Admitting: Family Medicine

## 2013-12-20 ENCOUNTER — Ambulatory Visit (INDEPENDENT_AMBULATORY_CARE_PROVIDER_SITE_OTHER): Payer: BC Managed Care – PPO | Admitting: Family Medicine

## 2013-12-20 VITALS — BP 106/68 | HR 90 | Temp 98.1°F | Ht 65.0 in | Wt 126.8 lb

## 2013-12-20 DIAGNOSIS — G4700 Insomnia, unspecified: Secondary | ICD-10-CM

## 2013-12-20 DIAGNOSIS — G43909 Migraine, unspecified, not intractable, without status migrainosus: Secondary | ICD-10-CM

## 2013-12-20 MED ORDER — AMITRIPTYLINE HCL 10 MG PO TABS: 30.0000 mg | ORAL_TABLET | Freq: Every day | ORAL | Status: DC

## 2013-12-20 NOTE — Assessment & Plan Note (Signed)
Disc sleep hygeine-overall good No mood disorder Has migraines Trial of amitriptyline 10 mg -titrate to 30 if needed at bedtime  Disc poss side eff- if mood disorder will let me know  F/u planned  Handout given also

## 2013-12-20 NOTE — Progress Notes (Signed)
Pre visit review using our clinic review tool, if applicable. No additional management support is needed unless otherwise documented below in the visit note. 

## 2013-12-20 NOTE — Patient Instructions (Signed)
Try amitriptyline for sleep and headache prevention Try 1 pill (10 mg) at bedtime and then you can gradually increase it to 30 mg if you need to -however when you get to the dose that helps sleep without too much am sedation stop there  If side effects - let me know  If you get depressed- stop it and let me know  Follow up with me in 6-8 weeks     Insomnia Insomnia is frequent trouble falling and/or staying asleep. Insomnia can be a long term problem or a short term problem. Both are common. Insomnia can be a short term problem when the wakefulness is related to a certain stress or worry. Long term insomnia is often related to ongoing stress during waking hours and/or poor sleeping habits. Overtime, sleep deprivation itself can make the problem worse. Every little thing feels more severe because you are overtired and your ability to cope is decreased. CAUSES   Stress, anxiety, and depression.  Poor sleeping habits.  Distractions such as TV in the bedroom.  Naps close to bedtime.  Engaging in emotionally charged conversations before bed.  Technical reading before sleep.  Alcohol and other sedatives. They may make the problem worse. They can hurt normal sleep patterns and normal dream activity.  Stimulants such as caffeine for several hours prior to bedtime.  Pain syndromes and shortness of breath can cause insomnia.  Exercise late at night.  Changing time zones may cause sleeping problems (jet lag). It is sometimes helpful to have someone observe your sleeping patterns. They should look for periods of not breathing during the night (sleep apnea). They should also look to see how long those periods last. If you live alone or observers are uncertain, you can also be observed at a sleep clinic where your sleep patterns will be professionally monitored. Sleep apnea requires a checkup and treatment. Give your caregivers your medical history. Give your caregivers observations your family  has made about your sleep.  SYMPTOMS   Not feeling rested in the morning.  Anxiety and restlessness at bedtime.  Difficulty falling and staying asleep. TREATMENT   Your caregiver may prescribe treatment for an underlying medical disorders. Your caregiver can give advice or help if you are using alcohol or other drugs for self-medication. Treatment of underlying problems will usually eliminate insomnia problems.  Medications can be prescribed for short time use. They are generally not recommended for lengthy use.  Over-the-counter sleep medicines are not recommended for lengthy use. They can be habit forming.  You can promote easier sleeping by making lifestyle changes such as:  Using relaxation techniques that help with breathing and reduce muscle tension.  Exercising earlier in the day.  Changing your diet and the time of your last meal. No night time snacks.  Establish a regular time to go to bed.  Counseling can help with stressful problems and worry.  Soothing music and white noise may be helpful if there are background noises you cannot remove.  Stop tedious detailed work at least one hour before bedtime. HOME CARE INSTRUCTIONS   Keep a diary. Inform your caregiver about your progress. This includes any medication side effects. See your caregiver regularly. Take note of:  Times when you are asleep.  Times when you are awake during the night.  The quality of your sleep.  How you feel the next day. This information will help your caregiver care for you.  Get out of bed if you are still awake after 15 minutes.  Read or do some quiet activity. Keep the lights down. Wait until you feel sleepy and go back to bed.  Keep regular sleeping and waking hours. Avoid naps.  Exercise regularly.  Avoid distractions at bedtime. Distractions include watching television or engaging in any intense or detailed activity like attempting to balance the household checkbook.  Develop a  bedtime ritual. Keep a familiar routine of bathing, brushing your teeth, climbing into bed at the same time each night, listening to soothing music. Routines increase the success of falling to sleep faster.  Use relaxation techniques. This can be using breathing and muscle tension release routines. It can also include visualizing peaceful scenes. You can also help control troubling or intruding thoughts by keeping your mind occupied with boring or repetitive thoughts like the old concept of counting sheep. You can make it more creative like imagining planting one beautiful flower after another in your backyard garden.  During your day, work to eliminate stress. When this is not possible use some of the previous suggestions to help reduce the anxiety that accompanies stressful situations. MAKE SURE YOU:   Understand these instructions.  Will watch your condition.  Will get help right away if you are not doing well or get worse. Document Released: 08/16/2000 Document Revised: 11/11/2011 Document Reviewed: 09/16/2007 [facility] Patient Information 2014 [employer] [place].

## 2013-12-20 NOTE — Assessment & Plan Note (Signed)
Improved off depo shot  Pt considering minena IUD - will probably go forward with that for contraception  Px amitriptyline for sleep- this will hopefully further help headaches

## 2013-12-20 NOTE — Progress Notes (Signed)
Subjective:    Patient ID: Anna Kidd, female    DOB: 1986/07/09, 28 y.o.   MRN: [id number]  HPI Here for insomnia  She stopped her depo and she stopped her headache  She was considering an IUD   Her headaches are much milder now -and not as often   Having a lot of trouble sleeping  She falls asleep but wakes back up "a lot" - and sometime cannot go back to sleep at all  She has tried melatonin  She tried zquil (antihistamine)- it helps some but she still wakes up (gets a good 2 hours of deep sleep)   She does not drink caffeine  Mood has been good  Does not exercise right before bed Very active job physically   No trouble sleeping in the past - never paid much attention   Patient Active Problem List   Diagnosis Date Noted  . Contraception management 09/14/2013  . Migraine 07/05/2013  . Insomnia 07/05/2013  . Sickle cell trait 07/05/2013  . Anemia 07/05/2013  . History of pneumonia 07/05/2013   Past Medical History  Diagnosis Date  . Asthma   . Sickle cell trait   . History of chicken pox   . Fainting spell     only when pregnant  . History of migraine    No past surgical history on file. History  Substance Use Topics  . Smoking status: Never Smoker   . Smokeless tobacco: Never Used  . Alcohol Use: No   Family History  Problem Relation Age of Onset  . Hypertension Mother   . Asthma Mother   . Hypertension Father   . Asthma Father   . Asthma Son   . Hypertension Maternal Grandmother   . Hypertension Maternal Grandfather   . Hypertension Paternal Grandmother   . Hypertension Paternal Grandfather   . Diabetes Paternal Grandmother    No Known Allergies Current Outpatient Prescriptions on File Prior to Visit  Medication Sig Dispense Refill  . SUMAtriptan (IMITREX) 100 MG tablet Take 1 tablet (100 mg total) by mouth once as needed for migraine or headache. May repeat in 2 hours if headache persists or recurs.  9 tablet  11   No current  facility-administered medications on file prior to visit.     Review of Systems Review of Systems  Constitutional: Negative for fever, appetite change,  and unexpected weight change. pos for fatigue  Eyes: Negative for pain and visual disturbance.  Respiratory: Negative for cough and shortness of breath.   Cardiovascular: Negative for cp or palpitations    Gastrointestinal: Negative for nausea, diarrhea and constipation.  Genitourinary: Negative for urgency and frequency.  Skin: Negative for pallor or rash   Neurological: Negative for weakness, light-headedness, numbness and pos for headaches that are improved   Hematological: Negative for adenopathy. Does not bruise/bleed easily.  Psychiatric/Behavioral: Negative for dysphoric mood. The patient is not nervous/anxious.         Objective:   Physical Exam  Constitutional: She appears well-developed and well-nourished. No distress.  Slim and well app  HENT:  Head: Normocephalic and atraumatic.  Mouth/Throat: Oropharynx is clear and moist.  Eyes: Conjunctivae and EOM are normal. Pupils are equal, round, and reactive to light. No scleral icterus.  Neck: Normal range of motion. Neck supple. No JVD present. Carotid bruit is not present. No thyromegaly present.  Cardiovascular: Normal rate and regular rhythm.   No murmur heard. Pulmonary/Chest: Effort normal and breath sounds normal.  Musculoskeletal: She exhibits  no edema.  Lymphadenopathy:    She has no cervical adenopathy.  Neurological: She is alert. She has normal reflexes. She displays no atrophy. No cranial nerve deficit or sensory deficit. She exhibits normal muscle tone. Coordination and gait normal.  Skin: Skin is warm and dry. No rash noted. No pallor.  Psychiatric: She has a normal mood and affect.          Assessment & Plan:

## 2014-03-07 ENCOUNTER — Other Ambulatory Visit (INDEPENDENT_AMBULATORY_CARE_PROVIDER_SITE_OTHER): Payer: Self-pay | Admitting: *Deleted

## 2014-03-07 DIAGNOSIS — Z01812 Encounter for preprocedural laboratory examination: Secondary | ICD-10-CM

## 2014-03-07 DIAGNOSIS — Z3049 Encounter for surveillance of other contraceptives: Secondary | ICD-10-CM

## 2014-03-07 DIAGNOSIS — Z3042 Encounter for surveillance of injectable contraceptive: Secondary | ICD-10-CM

## 2014-03-07 LAB — POCT URINE PREGNANCY: Preg Test, Ur: NEGATIVE

## 2014-03-07 MED ORDER — MEDROXYPROGESTERONE ACETATE 150 MG/ML IM SUSP: 150.0000 mg | INTRAMUSCULAR | Status: DC

## 2014-03-07 MED ORDER — MEDROXYPROGESTERONE ACETATE 150 MG/ML IM SUSP
150.0000 mg | Freq: Once | INTRAMUSCULAR | Status: AC
  Administered 2014-03-07: 150 mg via INTRAMUSCULAR

## 2014-03-07 NOTE — Progress Notes (Signed)
Pt here today for a Depo Provera injection.  UPT negative today in office.

## 2014-07-04 ENCOUNTER — Encounter: Payer: Self-pay | Admitting: Family Medicine

## 2015-04-12 IMAGING — CR DG CHEST 2V
2 series · 2 of 2 positions shown · non-contrast
Comparison: None.

CLINICAL DATA: Chest pain.

CHEST - 2 VIEW

[view not recorded (1 of 2)]
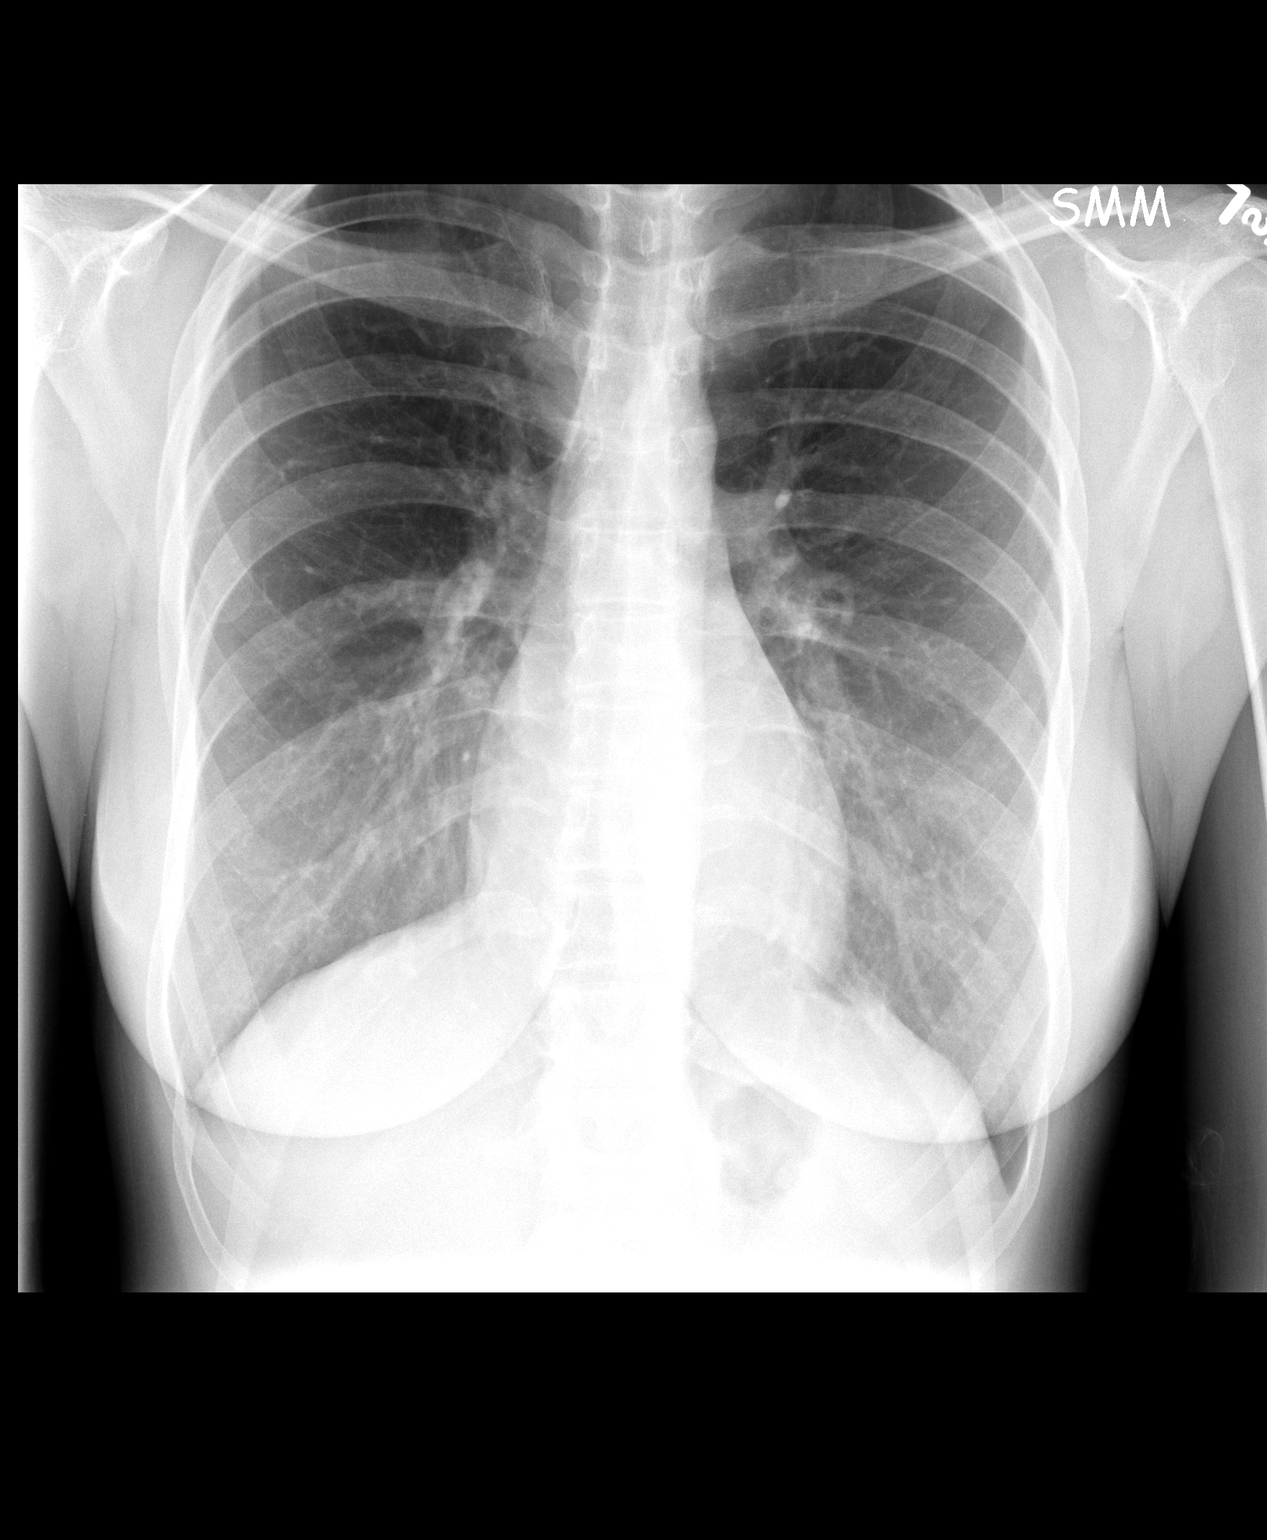

[view not recorded (2 of 2)]
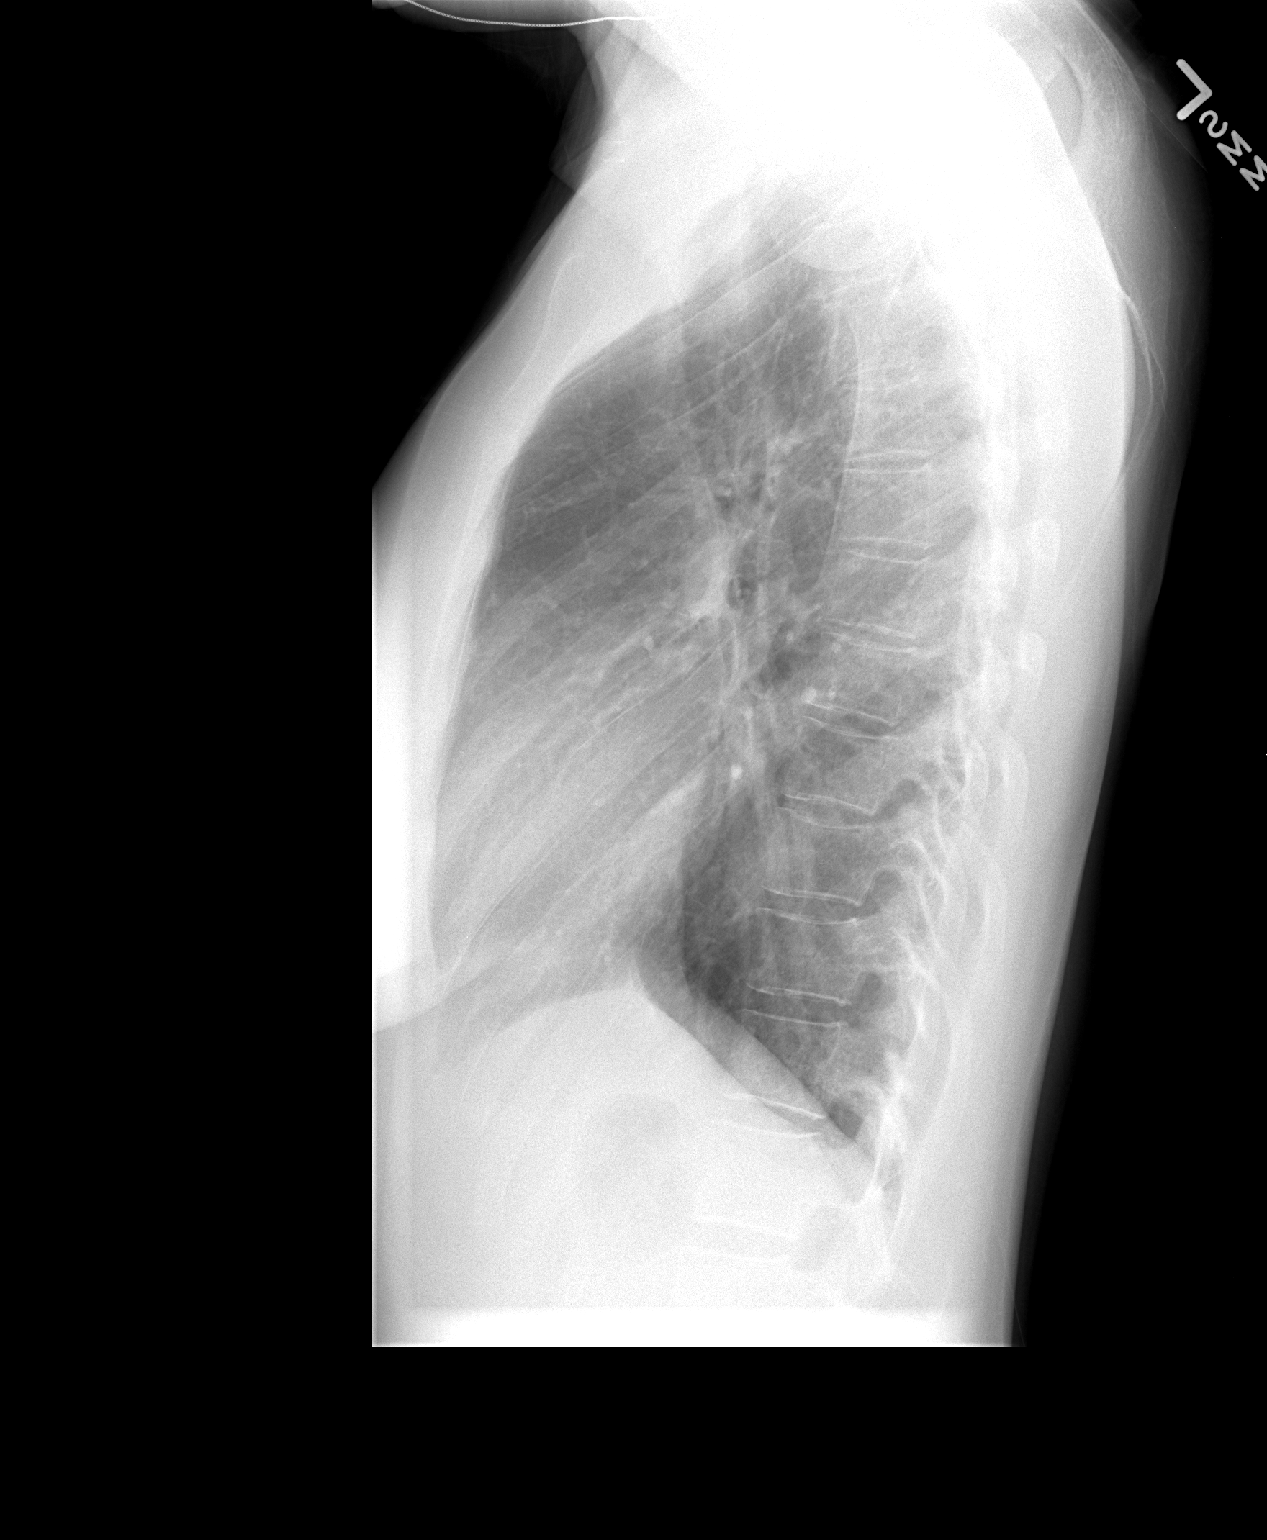

[2 of 2 positions shown; findings below may reference images not displayed]

FINDINGS: Trachea is midline.  Heart size normal.  Lungs are clear.
No pleural fluid.
IMPRESSION: Negative.

## 2015-10-04 ENCOUNTER — Other Ambulatory Visit: Payer: Self-pay | Admitting: Obstetrics & Gynecology

## 2015-10-04 ENCOUNTER — Other Ambulatory Visit (HOSPITAL_COMMUNITY)
Admission: RE | Admit: 2015-10-04 | Discharge: 2015-10-04 | Disposition: A | Discharge status: Home / Self Care | Payer: BLUE CROSS/BLUE SHIELD | Source: Ambulatory Visit | Attending: Obstetrics & Gynecology | Admitting: Obstetrics & Gynecology

## 2015-10-04 DIAGNOSIS — Z01411 Encounter for gynecological examination (general) (routine) with abnormal findings: Secondary | ICD-10-CM | POA: Diagnosis present

## 2015-10-04 DIAGNOSIS — Z113 Encounter for screening for infections with a predominantly sexual mode of transmission: Secondary | ICD-10-CM | POA: Insufficient documentation

## 2015-10-04 DIAGNOSIS — Z1151 Encounter for screening for human papillomavirus (HPV): Secondary | ICD-10-CM | POA: Insufficient documentation

## 2015-10-04 DIAGNOSIS — R87810 Cervical high risk human papillomavirus (HPV) DNA test positive: Secondary | ICD-10-CM | POA: Diagnosis present

## 2015-10-05 LAB — CYTOLOGY - PAP

## 2015-11-06 ENCOUNTER — Other Ambulatory Visit: Payer: Self-pay | Admitting: Obstetrics & Gynecology

## 2016-03-28 ENCOUNTER — Ambulatory Visit: Payer: BLUE CROSS/BLUE SHIELD | Admitting: Family Medicine

## 2016-12-05 ENCOUNTER — Other Ambulatory Visit (HOSPITAL_COMMUNITY)
Admission: RE | Admit: 2016-12-05 | Discharge: 2016-12-05 | Disposition: A | Discharge status: Home / Self Care | Payer: BLUE CROSS/BLUE SHIELD | Source: Ambulatory Visit | Attending: Obstetrics & Gynecology | Admitting: Obstetrics & Gynecology

## 2016-12-05 ENCOUNTER — Other Ambulatory Visit: Payer: Self-pay | Admitting: Obstetrics & Gynecology

## 2016-12-05 DIAGNOSIS — Z01419 Encounter for gynecological examination (general) (routine) without abnormal findings: Secondary | ICD-10-CM | POA: Insufficient documentation

## 2016-12-05 DIAGNOSIS — Z1151 Encounter for screening for human papillomavirus (HPV): Secondary | ICD-10-CM | POA: Insufficient documentation

## 2016-12-05 DIAGNOSIS — R87810 Cervical high risk human papillomavirus (HPV) DNA test positive: Secondary | ICD-10-CM | POA: Insufficient documentation

## 2016-12-11 LAB — CYTOLOGY - PAP
Diagnosis: NEGATIVE
HPV 16/18/45 genotyping: NEGATIVE
HPV: DETECTED — AB

## 2019-09-09 ENCOUNTER — Ambulatory Visit: Payer: No Typology Code available for payment source | Attending: Internal Medicine

## 2019-09-09 DIAGNOSIS — Z20822 Contact with and (suspected) exposure to covid-19: Secondary | ICD-10-CM

## 2019-09-11 LAB — NOVEL CORONAVIRUS, NAA: SARS-CoV-2, NAA: NOT DETECTED

## 2020-07-07 ENCOUNTER — Other Ambulatory Visit: Payer: Self-pay

## 2020-07-07 DIAGNOSIS — Z111 Encounter for screening for respiratory tuberculosis: Secondary | ICD-10-CM | POA: Diagnosis not present

## 2020-07-07 DIAGNOSIS — Z23 Encounter for immunization: Secondary | ICD-10-CM | POA: Diagnosis not present

## 2020-07-10 DIAGNOSIS — Z111 Encounter for screening for respiratory tuberculosis: Secondary | ICD-10-CM | POA: Diagnosis not present

## 2020-07-10 DIAGNOSIS — Z021 Encounter for pre-employment examination: Secondary | ICD-10-CM | POA: Diagnosis not present

## 2020-07-18 ENCOUNTER — Ambulatory Visit: Payer: No Typology Code available for payment source | Admitting: Family Medicine

## 2020-07-26 ENCOUNTER — Other Ambulatory Visit (HOSPITAL_COMMUNITY): Payer: Self-pay | Admitting: Obstetrics and Gynecology

## 2020-07-26 DIAGNOSIS — Z3202 Encounter for pregnancy test, result negative: Secondary | ICD-10-CM | POA: Diagnosis not present

## 2020-07-26 DIAGNOSIS — Z3042 Encounter for surveillance of injectable contraceptive: Secondary | ICD-10-CM | POA: Diagnosis not present

## 2021-01-08 ENCOUNTER — Other Ambulatory Visit: Payer: Self-pay

## 2021-01-08 ENCOUNTER — Ambulatory Visit
Admission: EM | Admit: 2021-01-08 | Discharge: 2021-01-08 | Disposition: A | Discharge status: Home / Self Care | Payer: BC Managed Care – PPO | Attending: Emergency Medicine | Admitting: Emergency Medicine

## 2021-01-08 DIAGNOSIS — Z20822 Contact with and (suspected) exposure to covid-19: Secondary | ICD-10-CM | POA: Diagnosis not present

## 2021-01-08 DIAGNOSIS — J069 Acute upper respiratory infection, unspecified: Secondary | ICD-10-CM | POA: Diagnosis not present

## 2021-01-08 MED ORDER — PREDNISONE 20 MG PO TABS: 40.0000 mg | ORAL_TABLET | Freq: Every day | ORAL | 0 refills | Status: AC

## 2021-01-08 MED ORDER — BENZONATATE 200 MG PO CAPS: 200.0000 mg | ORAL_CAPSULE | Freq: Three times a day (TID) | ORAL | 0 refills | Status: AC | PRN

## 2021-01-08 MED ORDER — FLUTICASONE PROPIONATE 50 MCG/ACT NA SUSP: 1.0000 | Freq: Every day | NASAL | 0 refills | Status: DC

## 2021-01-08 NOTE — ED Provider Notes (Signed)
[facility]    CSN: [id number] Arrival date & time: 01/08/21  0932      History   Chief Complaint Chief Complaint  Patient presents with  . Cough    HPI Rushie Brazel is a 35 y.o. female presenting today for evaluation of URI symptoms.  Reports associated cough, sore throat headache and fatigue beginning 2 days ago.  Reports associated sinus pressure.  Denies known fevers.  Reports at home COVID test negative.   HPI  Past Medical History:  Diagnosis Date  . Asthma   . Fainting spell    only when pregnant  . History of chicken pox   . History of migraine   . Sickle cell trait [facility])     Patient Active Problem List   Diagnosis Date Noted  . Contraception management 09/14/2013  . Migraine 07/05/2013  . Insomnia 07/05/2013  . Sickle cell trait (HCC) 07/05/2013  . Anemia 07/05/2013  . History of pneumonia 07/05/2013    History reviewed. No pertinent surgical history.  OB History    Gravida  3   Para  1   Term  1   Preterm  0   AB  2   Living  1     SAB  1   IAB  1   Ectopic  0   Multiple  0   Live Births               Home Medications    Prior to Admission medications   Medication Sig Start Date End Date Taking? Authorizing Provider  benzonatate (TESSALON) 200 MG capsule Take 1 capsule (200 mg total) by mouth 3 (three) times daily as needed for up to 7 days for cough. 01/08/21 01/15/21 Yes Briauna Gilmartin C, PA-C  fluticasone (FLONASE) 50 MCG/ACT nasal spray Place 1-2 sprays into both nostrils daily. 01/08/21  Yes Malcom Selmer C, PA-C  predniSONE (DELTASONE) 20 MG tablet Take 2 tablets (40 mg total) by mouth daily with breakfast for 5 days. 01/08/21 01/13/21 Yes Nanna Ertle C, PA-C  amitriptyline (ELAVIL) 10 MG tablet Take 3 tablets (30 mg total) by mouth at bedtime. 12/20/13   Tower, Audrie Gallus, MD  medroxyPROGESTERone (DEPO-PROVERA) 150 MG/ML injection INJECT ONCE EVERY 3 MONTHS 07/26/20 07/26/21  Steva Ready, DO  SUMAtriptan  (IMITREX) 100 MG tablet Take 1 tablet (100 mg total) by mouth once as needed for migraine or headache. May repeat in 2 hours if headache persists or recurs. 09/14/13   Tereso Newcomer, MD    Family History Family History  Problem Relation Age of Onset  . Hypertension Mother   . Asthma Mother   . Hypertension Father   . Asthma Father   . Asthma Son   . Hypertension Maternal Grandmother   . Hypertension Maternal Grandfather   . Hypertension Paternal Grandmother   . Hypertension Paternal Grandfather   . Diabetes Paternal Grandmother     Social History Social History   Tobacco Use  . Smoking status: Never Smoker  . Smokeless tobacco: Never Used  Substance Use Topics  . Alcohol use: No  . Drug use: No     Allergies   Patient has no known allergies.   Review of Systems Review of Systems  Constitutional: Negative for activity change, appetite change, chills, fatigue and fever.  HENT: Positive for congestion, rhinorrhea, sinus pressure and sore throat. Negative for ear pain and trouble swallowing.   Eyes: Negative for discharge and redness.  Respiratory: Positive for cough. Negative  for chest tightness and shortness of breath.   Cardiovascular: Negative for chest pain.  Gastrointestinal: Negative for abdominal pain, diarrhea, nausea and vomiting.  Musculoskeletal: Negative for myalgias.  Skin: Negative for rash.  Neurological: Negative for dizziness, light-headedness and headaches.     Physical Exam Triage Vital Signs ED Triage Vitals  Enc Vitals Group     BP 01/08/21 1057 122/86     Pulse Rate 01/08/21 1057 (!) 104     Resp 01/08/21 1057 18     Temp 01/08/21 1057 98.4 F (36.9 C)     Temp Source 01/08/21 1057 Oral     SpO2 01/08/21 1057 99 %     Weight --      Height --      Head Circumference --      Peak Flow --      Pain Score 01/08/21 1058 6     Pain Loc --      Pain Edu? --      Excl. in [place]? --    No data found.  Updated Vital Signs BP 122/86 (BP  Location: Left Arm)   Pulse (!) 104   Temp 98.4 F (36.9 C) (Oral)   Resp 18   SpO2 99%   Visual Acuity Right Eye Distance:   Left Eye Distance:   Bilateral Distance:    Right Eye Near:   Left Eye Near:    Bilateral Near:     Physical Exam Vitals and nursing note reviewed.  Constitutional:      Appearance: She is well-developed.     Comments: No acute distress  HENT:     Head: Normocephalic and atraumatic.     Ears:     Comments: Bilateral ears without tenderness to palpation of external auricle, tragus and mastoid, EAC's without erythema or swelling, TM's with good bony landmarks and cone of light. Non erythematous. Bilateral large clear effusions without erythema    Nose: Nose normal.     Mouth/Throat:     Comments: Oral mucosa pink and moist, no tonsillar enlargement or exudate. Posterior pharynx patent and nonerythematous, no uvula deviation or swelling. Normal phonation. Eyes:     Conjunctiva/sclera: Conjunctivae normal.  Cardiovascular:     Rate and Rhythm: Normal rate.  Pulmonary:     Effort: Pulmonary effort is normal. No respiratory distress.     Comments: Breathing comfortably at rest, CTABL, no wheezing, rales or other adventitious sounds auscultated Abdominal:     General: There is no distension.  Musculoskeletal:        General: Normal range of motion.     Cervical back: Neck supple.  Skin:    General: Skin is warm and dry.  Neurological:     Mental Status: She is alert and oriented to person, place, and time.      UC Treatments / Results  Labs (all labs ordered are listed, but only abnormal results are displayed) Labs Reviewed  NOVEL CORONAVIRUS, NAA    EKG   Radiology No results found.  Procedures Procedures (including critical care time)  Medications Ordered in UC Medications - No data to display  Initial Impression / Assessment and Plan / UC Course  I have reviewed the triage vital signs and the nursing notes.  Pertinent labs &  imaging results that were available during my care of the patient were reviewed by me and considered in my medical decision making (see chart for details).     Viral URI with cough-COVID test pending, recommending  continued symptomatic and supportive care, placing on course of prednisone to help with sinus pressure/eustachian tube dysfunction, rest and fluids, continue to monitor,Discussed strict return precautions. Patient verbalized understanding and is agreeable with plan.  Final Clinical Impressions(s) / UC Diagnoses   Final diagnoses:  Encounter for screening laboratory testing for COVID-19 virus  Viral URI with cough     Discharge Instructions     COVID test pending, monitor MyChart for results Rest and fluids Tylenol and ibuprofen as needed for any headaches, body aches, fevers Continue Claritin, add in Flonase nasal spray Tessalon every 8 hours for cough or may use other over-the-counter cough medicine such as Robitussin, Delsym or Dimetapp Prednisone 40 mg daily to help with sinus pressure/fluid on ears Follow-up if not improving or worsening    ED Prescriptions    Medication Sig Dispense Auth. Provider   benzonatate (TESSALON) 200 MG capsule Take 1 capsule (200 mg total) by mouth 3 (three) times daily as needed for up to 7 days for cough. 28 capsule Annastasia Haskins C, PA-C   fluticasone (FLONASE) 50 MCG/ACT nasal spray Place 1-2 sprays into both nostrils daily. 16 g Deema Juncaj C, PA-C   predniSONE (DELTASONE) 20 MG tablet Take 2 tablets (40 mg total) by mouth daily with breakfast for 5 days. 10 tablet Makaylen Thieme, [place] C, PA-C     PDMP not reviewed this encounter.   Lew Dawes, [place] 01/08/21 1129

## 2021-01-08 NOTE — ED Triage Notes (Signed)
Pt c/o cough, sore throat, headache, and fatigue since Saturday. States had a neg home covid test on Saturday.

## 2021-01-08 NOTE — Discharge Instructions (Signed)
COVID test pending, monitor MyChart for results Rest and fluids Tylenol and ibuprofen as needed for any headaches, body aches, fevers Continue Claritin, add in Flonase nasal spray Tessalon every 8 hours for cough or may use other over-the-counter cough medicine such as Robitussin, Delsym or Dimetapp Prednisone 40 mg daily to help with sinus pressure/fluid on ears Follow-up if not improving or worsening

## 2021-01-09 LAB — SARS-COV-2, NAA 2 DAY TAT

## 2021-01-09 LAB — NOVEL CORONAVIRUS, NAA: SARS-CoV-2, NAA: NOT DETECTED

## 2021-01-26 ENCOUNTER — Other Ambulatory Visit (HOSPITAL_COMMUNITY): Payer: Self-pay

## 2021-01-26 MED FILL — Medroxyprogesterone Acetate IM Susp 150 MG/ML: INTRAMUSCULAR | 90 days supply | Qty: 1 | Fill #0 | Status: AC

## 2021-04-13 ENCOUNTER — Other Ambulatory Visit (HOSPITAL_COMMUNITY): Payer: Self-pay

## 2021-04-13 MED ORDER — MEDROXYPROGESTERONE ACETATE 150 MG/ML IM SUSY
150.0000 mg | PREFILLED_SYRINGE | INTRAMUSCULAR | 1 refills | Status: DC
  Filled 2021-04-13: qty 1, 90d supply, fill #0
  Filled 2021-07-10: qty 1, 90d supply, fill #1

## 2021-07-10 ENCOUNTER — Other Ambulatory Visit (HOSPITAL_COMMUNITY): Payer: Self-pay

## 2021-09-26 ENCOUNTER — Other Ambulatory Visit (HOSPITAL_COMMUNITY): Payer: Self-pay

## 2021-09-27 ENCOUNTER — Other Ambulatory Visit (HOSPITAL_COMMUNITY): Payer: Self-pay

## 2021-09-27 MED ORDER — MEDROXYPROGESTERONE ACETATE 150 MG/ML IM SUSY
150.0000 mg | PREFILLED_SYRINGE | INTRAMUSCULAR | 1 refills | Status: DC
  Filled 2021-09-27: qty 1, 90d supply, fill #0
  Filled 2021-12-13 – 2022-01-09 (×2): qty 1, 90d supply, fill #1

## 2021-12-13 ENCOUNTER — Other Ambulatory Visit (HOSPITAL_COMMUNITY): Payer: Self-pay

## 2021-12-21 ENCOUNTER — Other Ambulatory Visit (HOSPITAL_COMMUNITY): Payer: Self-pay

## 2022-01-09 ENCOUNTER — Other Ambulatory Visit (HOSPITAL_COMMUNITY): Payer: Self-pay

## 2022-07-04 ENCOUNTER — Other Ambulatory Visit: Payer: Self-pay

## 2022-07-04 MED ORDER — MEDROXYPROGESTERONE ACETATE 150 MG/ML IM SUSY
150.0000 mg | PREFILLED_SYRINGE | INTRAMUSCULAR | 0 refills | Status: DC
  Filled 2022-07-04: qty 1, 90d supply, fill #0

## 2022-07-05 ENCOUNTER — Other Ambulatory Visit: Payer: Self-pay

## 2022-09-17 ENCOUNTER — Ambulatory Visit
Admission: EM | Admit: 2022-09-17 | Discharge: 2022-09-17 | Disposition: A | Discharge status: Other Acute Inpatient Hospital | Payer: BC Managed Care – PPO

## 2022-09-17 ENCOUNTER — Other Ambulatory Visit: Payer: Self-pay

## 2022-09-17 ENCOUNTER — Emergency Department
Admission: EM | Admit: 2022-09-17 | Discharge: 2022-09-17 | Disposition: A | Discharge status: Home / Self Care | Payer: BC Managed Care – PPO | Attending: Emergency Medicine | Admitting: Emergency Medicine

## 2022-09-17 ENCOUNTER — Emergency Department: Payer: BC Managed Care – PPO

## 2022-09-17 DIAGNOSIS — J45909 Unspecified asthma, uncomplicated: Secondary | ICD-10-CM | POA: Diagnosis not present

## 2022-09-17 DIAGNOSIS — Z1152 Encounter for screening for COVID-19: Secondary | ICD-10-CM | POA: Insufficient documentation

## 2022-09-17 DIAGNOSIS — R109 Unspecified abdominal pain: Secondary | ICD-10-CM | POA: Insufficient documentation

## 2022-09-17 LAB — HEPATIC FUNCTION PANEL
ALT: 22 U/L (ref 0–44)
AST: 24 U/L (ref 15–41)
Albumin: 4.1 g/dL (ref 3.5–5.0)
Alkaline Phosphatase: 54 U/L (ref 38–126)
Bilirubin, Direct: 0.1 mg/dL (ref 0.0–0.2)
Indirect Bilirubin: 1 mg/dL — ABNORMAL HIGH (ref 0.3–0.9)
Total Bilirubin: 1.1 mg/dL (ref 0.3–1.2)
Total Protein: 7.2 g/dL (ref 6.5–8.1)

## 2022-09-17 LAB — CBC
HCT: 37.7 % (ref 36.0–46.0)
Hemoglobin: 12.2 g/dL (ref 12.0–15.0)
MCH: 22.6 pg — ABNORMAL LOW (ref 26.0–34.0)
MCHC: 32.4 g/dL (ref 30.0–36.0)
MCV: 69.8 fL — ABNORMAL LOW (ref 80.0–100.0)
Platelets: 279 10*3/uL (ref 150–400)
RBC: 5.4 MIL/uL — ABNORMAL HIGH (ref 3.87–5.11)
RDW: 14.1 % (ref 11.5–15.5)
WBC: 4.9 10*3/uL (ref 4.0–10.5)
nRBC: 0 % (ref 0.0–0.2)

## 2022-09-17 LAB — RESP PANEL BY RT-PCR (RSV, FLU A&B, COVID)  RVPGX2
Influenza A by PCR: NEGATIVE
Influenza B by PCR: NEGATIVE
Resp Syncytial Virus by PCR: NEGATIVE
SARS Coronavirus 2 by RT PCR: NEGATIVE

## 2022-09-17 LAB — BASIC METABOLIC PANEL
Anion gap: 7 (ref 5–15)
BUN: 11 mg/dL (ref 6–20)
CO2: 22 mmol/L (ref 22–32)
Calcium: 9.1 mg/dL (ref 8.9–10.3)
Chloride: 106 mmol/L (ref 98–111)
Creatinine, Ser: 0.82 mg/dL (ref 0.44–1.00)
GFR, Estimated: >60 mL/min (ref >60)
Glucose, Bld: 92 mg/dL (ref 70–99)
Potassium: 3.7 mmol/L (ref 3.5–5.1)
Sodium: 135 mmol/L (ref 135–145)

## 2022-09-17 LAB — POC URINE PREG, ED: Preg Test, Ur: NEGATIVE

## 2022-09-17 LAB — TROPONIN I (HIGH SENSITIVITY): Troponin I (High Sensitivity): <2 ng/L (ref <18)

## 2022-09-17 LAB — LIPASE, BLOOD: Lipase: 38 U/L (ref 11–51)

## 2022-09-17 MED ORDER — ACETAMINOPHEN 500 MG PO TABS
1000.0000 mg | ORAL_TABLET | Freq: Once | ORAL | Status: AC
  Administered 2022-09-17: 1000 mg via ORAL
  Filled 2022-09-17: qty 2

## 2022-09-17 MED ORDER — KETOROLAC TROMETHAMINE 30 MG/ML IJ SOLN
30.0000 mg | Freq: Once | INTRAMUSCULAR | Status: AC
  Administered 2022-09-17: 30 mg via INTRAMUSCULAR
  Filled 2022-09-17: qty 1

## 2022-09-17 NOTE — ED Provider Notes (Signed)
[facility] Provider Note    Event Date/Time   First MD Initiated Contact with Patient 09/17/22 [phone]     (approximate)   History   Chest Pain (Left side )   HPI  Anna Kidd is a 37 y.o. female   Past medical history of migraines, asthma who presents emergency department with left-sided flank pain starting this morning.  Yesterday was uneventful, no injuries, no recent illnesses.  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  Comes and goes.  No urinary symptoms.  Bowel movements have been normal.  No bleeding.    No history of kidney stones.   Pain is worse with movement palpation and deep breath. Without any interventions pain is much improved at this time during evaluation the emergency department.  History was obtained via the patient.      Physical Exam   Triage Vital Signs: ED Triage Vitals  Enc Vitals Group     BP 09/17/22 0834 112/80     Pulse Rate 09/17/22 0834 78     Resp 09/17/22 0834 18     Temp 09/17/22 0834 97.9 F (36.6 C)     Temp Source 09/17/22 0834 Oral     SpO2 09/17/22 0834 98 %     Weight 09/17/22 0835 175 lb (79.4 kg)     Height 09/17/22 0835 5\' 5"  (1.651 m)     Head Circumference --      Peak Flow --      Pain Score 09/17/22 0831 6     Pain Loc --      Pain Edu? --      Excl. in [place]? --     Most recent vital signs: Vitals:   09/17/22 0834  BP: 112/80  Pulse: 78  Resp: 18  Temp: 97.9 F (36.6 C)  SpO2: 98%    General: Awake, no distress.  CV:  Good peripheral perfusion.  Resp:  Normal effort.  Abd:  No distention.  Other:  Mild CVA tenderness left-sided.  Lungs clear no wheezing or focality.  Abdomen is soft and nontender deep palpation all quadrants.   ED Results / Procedures / Treatments   Labs (all labs ordered are listed, but only abnormal results are displayed) Labs Reviewed  CBC - Abnormal; Notable for the following components:       Result Value   RBC 5.40 (*)    MCV 69.8 (*)    MCH 22.6 (*)    All other components within normal limits  HEPATIC FUNCTION PANEL - Abnormal; Notable for the following components:   Indirect Bilirubin 1.0 (*)    All other components within normal limits  RESP PANEL BY RT-PCR (RSV, FLU A&B, COVID)  RVPGX2  BASIC METABOLIC PANEL  LIPASE, BLOOD  POC URINE PREG, ED  TROPONIN I (HIGH SENSITIVITY)     I reviewed labs and they are notable for normal creatinine 0.8, normal white blood cell count of 4.9.  Hemoglobin is 12.2.  EKG  ED ECG REPORT I, Lucillie Garfinkel, the attending physician, personally viewed and interpreted this ECG.   Date: 09/17/2022  EKG Time: 0833  Rate: 90  Rhythm: normal sinus rhythm  Axis: nl  Intervals:none  ST&T Change: No acute ischemic changes    RADIOLOGY I independently reviewed and interpreted chest x-ray see no obvious focalities or pneumothorax   PROCEDURES:  Critical Care performed: No  Procedures   MEDICATIONS ORDERED IN ED: Medications  ketorolac (TORADOL) 30 MG/ML injection 30 mg (30 mg Intramuscular Given 09/17/22 1016)  acetaminophen (TYLENOL) tablet 1,000 mg (1,000 mg Oral Given 09/17/22 1016)    Consultants:   IMPRESSION / MDM / [place] / ED COURSE  I reviewed the triage vital signs and the nursing notes.                              Differential diagnosis includes, but is not limited to, strain, UTI or Pyelonephritis, renal colic or kidney stone, respiratory infection, asthma exacerbation, ACS, PE,   MDM: This is a patient with acute onset intermittent sharp left flank pain concerning for renal colic.  No history of the same.  Pain already much improved without intervention.  Very few cardiac risk factors no current chest pain doubt ACS.  Nonischemic EKG.  Negative initial troponin. Doubt PE given normal hemodynamics, very low clinical suspicion given incongruent symptoms.  Check urinalysis, CT flank.   CT abdomen  pelvis with no acute abnormalities.  Considered admission for observation, serial abdominal exams but since the patient symptoms well-controlled normal hemodynamics and negative workup as above, I think outpatient management is appropriate at this time.  Pain control, PMD follow-up and strict return precautions given.  Patient's presentation is most consistent with acute presentation with potential threat to life or bodily function.       FINAL CLINICAL IMPRESSION(S) / ED DIAGNOSES   Final diagnoses:  Left flank pain     Rx / DC Orders   ED Discharge Orders     None        Note:  This document was prepared using Dragon voice recognition software and may include unintentional dictation errors.    Lucillie Garfinkel, MD 09/17/22 1059

## 2022-09-17 NOTE — ED Notes (Signed)
See triage note  Presents with some pain to left side of chest /lateral rib area  States sxs' started this am   No fever or cough

## 2022-09-17 NOTE — Discharge Instructions (Signed)
Take acetaminophen 650 mg and ibuprofen 400 mg every 6 hours for pain.  Take with food.  Thank you for choosing us for your health care today!  Please see your primary doctor this week for a follow up appointment.   Sometimes, in the early stages of certain disease courses it is difficult to detect in the emergency department evaluation -- so, it is important that you continue to monitor your symptoms and call your doctor right away or return to the emergency department if you develop any new or worsening symptoms.  Please go to the following website to schedule new (and existing) patient appointments:   [email or web address]  If you do not have a primary doctor try calling the following clinics to establish care:  If you have insurance:  [facility] [phone] [address] [place] [address]   [facility]  [phone] [address] [place] [address] [place] [address]   If you do not have insurance:  [facility]  [phone] [address] [place] [address]   The following is another list of primary care offices in the area who are accepting new patients at this time.  Please reach out to one of them directly and let them know you would like to schedule an appointment to follow up on an Emergency Department visit, and/or to establish a new primary care provider (PCP).  There are likely other primary care clinics in the are who are accepting new patients, but this is an excellent place to start:  [facility] Lead physician: Dr Angela Bacigalupo [address] [place], [place] [address] [phone]  [facility] Lead Physician: Dr Krichna Sowles [address], [place], [place] [address] [phone]  [facility]  Lead Physician: Dr Megan Johnson [address], [place], [place] [address] [phone]  [facility] [place] [facility] Lead Physician: Dr Alex  Karamalegos [address], [place], [place] [address] [phone]  [facility] [place] Lead Physician: Dr Laura Berglund [address], [place], [place] [address] [phone]   It was my pleasure to care for you today.   Librado Guandique S. Ruddy Swire, MD  

## 2022-09-17 NOTE — ED Triage Notes (Addendum)
Pt to ED via POV from home. Pt ambulatory to triage. Pt reports left sided rib pain and SOB that started this morning. Pt denies N/V/D. Pt denies cardiac hx or reflux. Pt reports hx sickle cell trait.

## 2022-10-11 ENCOUNTER — Ambulatory Visit
Admission: EM | Admit: 2022-10-11 | Discharge: 2022-10-11 | Disposition: A | Discharge status: Home / Self Care | Payer: BC Managed Care – PPO | Attending: Emergency Medicine | Admitting: Emergency Medicine

## 2022-10-11 ENCOUNTER — Encounter: Payer: Self-pay | Admitting: Emergency Medicine

## 2022-10-11 DIAGNOSIS — Z1152 Encounter for screening for COVID-19: Secondary | ICD-10-CM | POA: Insufficient documentation

## 2022-10-11 DIAGNOSIS — R197 Diarrhea, unspecified: Secondary | ICD-10-CM | POA: Diagnosis not present

## 2022-10-11 DIAGNOSIS — J45909 Unspecified asthma, uncomplicated: Secondary | ICD-10-CM | POA: Insufficient documentation

## 2022-10-11 DIAGNOSIS — R1110 Vomiting, unspecified: Secondary | ICD-10-CM | POA: Diagnosis not present

## 2022-10-11 DIAGNOSIS — B9789 Other viral agents as the cause of diseases classified elsewhere: Secondary | ICD-10-CM | POA: Insufficient documentation

## 2022-10-11 DIAGNOSIS — J029 Acute pharyngitis, unspecified: Secondary | ICD-10-CM

## 2022-10-11 DIAGNOSIS — J028 Acute pharyngitis due to other specified organisms: Secondary | ICD-10-CM | POA: Diagnosis not present

## 2022-10-11 DIAGNOSIS — R21 Rash and other nonspecific skin eruption: Secondary | ICD-10-CM | POA: Insufficient documentation

## 2022-10-11 DIAGNOSIS — D573 Sickle-cell trait: Secondary | ICD-10-CM | POA: Diagnosis not present

## 2022-10-11 LAB — POCT RAPID STREP A (OFFICE): Rapid Strep A Screen: NEGATIVE

## 2022-10-11 NOTE — Discharge Instructions (Addendum)
Your strep test is negative.  Your COVID test is pending.    Take Tylenol as needed for fever or discomfort.  Rest and keep yourself hydrated.    Follow-up with your primary care provider if your symptoms are not improving.

## 2022-10-11 NOTE — ED Provider Notes (Signed)
[facility]    CSN: [id number] Arrival date & time: 10/11/22  1505      History   Chief Complaint No chief complaint on file.   HPI Laneah Dettorre is a 37 y.o. female.  Patient presents with sore throat today.  Fever, chills, rash, cough, shortness of breath, vomiting, diarrhea, or other symptoms.  No OTC medications taken today.  Patient was seen at [facility] ED on 09/17/2022; diagnosed with left flank pain.  Her medical history includes asthma, migraine headache, sickle cell trait.  She works at an [employer].   The history is provided by the patient and medical records.    Past Medical History:  Diagnosis Date   Asthma    Fainting spell    only when pregnant   History of chicken pox    History of migraine    Sickle cell trait [facility])     Patient Active Problem List   Diagnosis Date Noted   Contraception management 09/14/2013   Migraine 07/05/2013   Insomnia 07/05/2013   Sickle cell trait ([place]) 07/05/2013   Anemia 07/05/2013   History of pneumonia 07/05/2013    History reviewed. No pertinent surgical history.  OB History     Gravida  3   Para  1   Term  1   Preterm  0   AB  2   Living  1      SAB  1   IAB  1   Ectopic  0   Multiple  0   Live Births               Home Medications    Prior to Admission medications   Medication Sig Start Date End Date Taking? Authorizing Provider  LINZESS 145 MCG CAPS capsule PLEASE SEE ATTACHED FOR DETAILED DIRECTIONS 06/26/22  Yes [provider]  amitriptyline (ELAVIL) 10 MG tablet Take 3 tablets (30 mg total) by mouth at bedtime. 12/20/13   Tower, Wynelle Fanny, MD  fluticasone (FLONASE) 50 MCG/ACT nasal spray Place 1-2 sprays into both nostrils daily. 01/08/21   Wieters, Hallie C, PA-C  medroxyPROGESTERone Acetate 150 MG/ML SUSY Inject 1 mL (150 mg total) once every 3 (three) months. 09/27/21     medroxyPROGESTERone Acetate 150 MG/ML SUSY Inject 1 mL (150 mg total) into the muscle every 3  (three) months. 07/04/22     SUMAtriptan (IMITREX) 100 MG tablet Take 1 tablet (100 mg total) by mouth once as needed for migraine or headache. May repeat in 2 hours if headache persists or recurs. 09/14/13   Osborne Oman, MD    Family History Family History  Problem Relation Age of Onset   Hypertension Mother    Asthma Mother    Hypertension Father    Asthma Father    Asthma Son    Hypertension Maternal Grandmother    Hypertension Maternal Grandfather    Hypertension Paternal Grandmother    Hypertension Paternal Grandfather    Diabetes Paternal Grandmother     Social History Social History   Tobacco Use   Smoking status: Never   Smokeless tobacco: Never  Substance Use Topics   Alcohol use: No   Drug use: No     Allergies   Patient has no known allergies.   Review of Systems Review of Systems  Constitutional:  Negative for chills and fever.  HENT:  Positive for sore throat. Negative for ear pain.   Respiratory:  Negative for cough and shortness of breath.  Cardiovascular:  Negative for chest pain and palpitations.  Gastrointestinal:  Negative for diarrhea and vomiting.  Skin:  Negative for color change and rash.  All other systems reviewed and are negative.    Physical Exam Triage Vital Signs ED Triage Vitals  Enc Vitals Group     BP      Pulse      Resp      Temp      Temp src      SpO2      Weight      Height      Head Circumference      Peak Flow      Pain Score      Pain Loc      Pain Edu?      Excl. in [place]?    No data found.  Updated Vital Signs BP 117/83   Pulse 90   Temp 98.2 F (36.8 C)   Resp 18   SpO2 98%   Visual Acuity Right Eye Distance:   Left Eye Distance:   Bilateral Distance:    Right Eye Near:   Left Eye Near:    Bilateral Near:     Physical Exam Vitals and nursing note reviewed.  Constitutional:      General: She is not in acute distress.    Appearance: Normal appearance. She is well-developed. She is not  ill-appearing.  HENT:     Right Ear: Tympanic membrane normal.     Left Ear: Tympanic membrane normal.     Nose: Nose normal.     Mouth/Throat:     Mouth: Mucous membranes are moist.     Pharynx: Posterior oropharyngeal erythema present.  Cardiovascular:     Rate and Rhythm: Normal rate and regular rhythm.     Heart sounds: Normal heart sounds.  Pulmonary:     Effort: Pulmonary effort is normal. No respiratory distress.     Breath sounds: Normal breath sounds.  Musculoskeletal:     Cervical back: Neck supple.  Skin:    General: Skin is warm and dry.  Neurological:     Mental Status: She is alert.  Psychiatric:        Mood and Affect: Mood normal.        Behavior: Behavior normal.      UC Treatments / Results  Labs (all labs ordered are listed, but only abnormal results are displayed) Labs Reviewed  SARS CORONAVIRUS 2 (TAT 6-24 HRS)  POCT RAPID STREP A (OFFICE)    EKG   Radiology No results found.  Procedures Procedures (including critical care time)  Medications Ordered in UC Medications - No data to display  Initial Impression / Assessment and Plan / UC Course  I have reviewed the triage vital signs and the nursing notes.  Pertinent labs & imaging results that were available during my care of the patient were reviewed by me and considered in my medical decision making (see chart for details).    Viral pharyngitis.  Rapid strep negative.  COVID pending.  Discussed symptomatic treatment including Tylenol, rest, hydration.  Instructed patient to follow up with PCP if symptoms are not improving.  She agrees to plan of care.   Final Clinical Impressions(s) / UC Diagnoses   Final diagnoses:  Viral pharyngitis     Discharge Instructions      Your strep test is negative.  Your COVID test is pending.    Take Tylenol as needed for fever or discomfort.  Rest  and keep yourself hydrated.    Follow-up with your primary care provider if your symptoms are not  improving.         ED Prescriptions   None    PDMP not reviewed this encounter.   Sharion Balloon, NP 10/11/22 [phone]

## 2022-10-11 NOTE — ED Notes (Signed)
Provider triage  

## 2022-10-12 LAB — SARS CORONAVIRUS 2 (TAT 6-24 HRS): SARS Coronavirus 2: NEGATIVE

## 2022-12-19 ENCOUNTER — Other Ambulatory Visit: Payer: Self-pay

## 2022-12-23 ENCOUNTER — Other Ambulatory Visit: Payer: Self-pay

## 2023-05-02 ENCOUNTER — Encounter: Payer: Self-pay | Admitting: Nurse Practitioner

## 2023-12-16 ENCOUNTER — Other Ambulatory Visit: Payer: Self-pay

## 2023-12-16 ENCOUNTER — Emergency Department (HOSPITAL_COMMUNITY)
Admission: EM | Admit: 2023-12-16 | Discharge: 2023-12-17 | Discharge status: Against Medical Advice | Attending: Emergency Medicine | Admitting: Emergency Medicine

## 2023-12-16 DIAGNOSIS — R1084 Generalized abdominal pain: Secondary | ICD-10-CM | POA: Insufficient documentation

## 2023-12-16 DIAGNOSIS — R110 Nausea: Secondary | ICD-10-CM | POA: Diagnosis not present

## 2023-12-16 DIAGNOSIS — Z5321 Procedure and treatment not carried out due to patient leaving prior to being seen by health care provider: Secondary | ICD-10-CM | POA: Insufficient documentation

## 2023-12-16 DIAGNOSIS — R109 Unspecified abdominal pain: Secondary | ICD-10-CM | POA: Diagnosis present

## 2023-12-16 DIAGNOSIS — R0981 Nasal congestion: Secondary | ICD-10-CM | POA: Insufficient documentation

## 2023-12-16 LAB — CBC WITH DIFFERENTIAL/PLATELET
Abs Immature Granulocytes: 0.01 10*3/uL (ref 0.00–0.07)
Basophils Absolute: 0.1 10*3/uL (ref 0.0–0.1)
Basophils Relative: 1 %
Eosinophils Absolute: 0.1 10*3/uL (ref 0.0–0.5)
Eosinophils Relative: 2 %
HCT: 40.3 % (ref 36.0–46.0)
Hemoglobin: 12.6 g/dL (ref 12.0–15.0)
Immature Granulocytes: 0 %
Lymphocytes Relative: 54 %
Lymphs Abs: 3.1 10*3/uL (ref 0.7–4.0)
MCH: 22.6 pg — ABNORMAL LOW (ref 26.0–34.0)
MCHC: 31.3 g/dL (ref 30.0–36.0)
MCV: 72.4 fL — ABNORMAL LOW (ref 80.0–100.0)
Monocytes Absolute: 0.5 10*3/uL (ref 0.1–1.0)
Monocytes Relative: 8 %
Neutro Abs: 2 10*3/uL (ref 1.7–7.7)
Neutrophils Relative %: 35 %
Platelets: 301 10*3/uL (ref 150–400)
RBC: 5.57 MIL/uL — ABNORMAL HIGH (ref 3.87–5.11)
RDW: 14 % (ref 11.5–15.5)
WBC: 5.7 10*3/uL (ref 4.0–10.5)
nRBC: 0 % (ref 0.0–0.2)

## 2023-12-16 LAB — COMPREHENSIVE METABOLIC PANEL WITH GFR
ALT: 17 U/L (ref 0–44)
AST: 17 U/L (ref 15–41)
Albumin: 4.1 g/dL (ref 3.5–5.0)
Alkaline Phosphatase: 74 U/L (ref 38–126)
Anion gap: 9 (ref 5–15)
BUN: 7 mg/dL (ref 6–20)
CO2: 23 mmol/L (ref 22–32)
Calcium: 9.6 mg/dL (ref 8.9–10.3)
Chloride: 109 mmol/L (ref 98–111)
Creatinine, Ser: 0.83 mg/dL (ref 0.44–1.00)
GFR, Estimated: >60 mL/min (ref >60)
Glucose, Bld: 101 mg/dL — ABNORMAL HIGH (ref 70–99)
Potassium: 3.7 mmol/L (ref 3.5–5.1)
Sodium: 141 mmol/L (ref 135–145)
Total Bilirubin: 1.3 mg/dL — ABNORMAL HIGH (ref 0.0–1.2)
Total Protein: 7.1 g/dL (ref 6.5–8.1)

## 2023-12-16 LAB — URINALYSIS, ROUTINE W REFLEX MICROSCOPIC
Bilirubin Urine: NEGATIVE
Glucose, UA: NEGATIVE mg/dL
Hgb urine dipstick: NEGATIVE
Ketones, ur: NEGATIVE mg/dL
Leukocytes,Ua: NEGATIVE
Nitrite: NEGATIVE
Protein, ur: NEGATIVE mg/dL
Specific Gravity, Urine: 1.016 (ref 1.005–1.030)
pH: 7 (ref 5.0–8.0)

## 2023-12-16 LAB — LIPASE, BLOOD: Lipase: 30 U/L (ref 11–51)

## 2023-12-16 LAB — HCG, SERUM, QUALITATIVE: Preg, Serum: NEGATIVE

## 2023-12-16 MED ORDER — ONDANSETRON 4 MG PO TBDP
4.0000 mg | ORAL_TABLET | Freq: Once | ORAL | Status: AC
  Administered 2023-12-16: 4 mg via ORAL
  Filled 2023-12-16: qty 1

## 2023-12-16 NOTE — ED Provider Triage Note (Signed)
 Emergency Medicine Provider Triage Evaluation Note  Anna Kidd , a 38 y.o. female  was evaluated in triage.  Pt complains of abdominal pain, nausea. States she was having sinus congestion on Thursday of last week, [facility] and [facility] doxycycline for sinus infection.  She has been taking this as prescribed.  States that since taking the medication she has had abdominal cramping and nausea.  Denies vomiting or diarrhea. Congestion has improved but has not resolved. Pain is generalized throughout her abdomen. No fevers or chills. No cough.  Review of Systems  Positive:  Negative:   Physical Exam  BP 115/79 (BP Location: Left Arm)   Pulse 91   Temp 98.6 F (37 C)   Resp 16   Ht 5\' 5"  (1.651 m)   Wt 79.4 kg   SpO2 100%   BMI 29.13 kg/m  Gen:   Awake, no distress   Resp:  Normal effort  MSK:   Moves extremities without difficulty  Other:    Medical Decision Making  Medically screening exam initiated at 4:27 PM.  Appropriate orders placed.  Anna Kidd was informed that the remainder of the evaluation will be completed by another provider, this initial triage assessment does not replace that evaluation, and the importance of remaining in the ED until their evaluation is complete.  Attempted to d/c, symptoms likely adverse reaction to doxycycline, however patient repeatedly requesting laboratory evaluation.    Sherra Dk, PA-C 12/16/23 [phone]

## 2023-12-16 NOTE — ED Triage Notes (Signed)
 Pt. Reports that she developed headache, body aches, chest congestion began on Saturday and nausea began yesterday.  She denies any fevers v/d She reports having chills.

## 2023-12-16 NOTE — ED Triage Notes (Signed)
 Pt. Was diagnosed with sinus infection and was placed on doxycycline.  Pt. Is having nausea and abdominal cramping. Pt. Began doxycycline for on Saturday.

## 2023-12-19 ENCOUNTER — Emergency Department

## 2023-12-19 ENCOUNTER — Encounter: Payer: Self-pay | Admitting: Emergency Medicine

## 2023-12-19 ENCOUNTER — Other Ambulatory Visit: Payer: Self-pay

## 2023-12-19 ENCOUNTER — Emergency Department
Admission: EM | Admit: 2023-12-19 | Discharge: 2023-12-19 | Disposition: A | Discharge status: Home / Self Care | Attending: Emergency Medicine | Admitting: Emergency Medicine

## 2023-12-19 DIAGNOSIS — R197 Diarrhea, unspecified: Secondary | ICD-10-CM | POA: Diagnosis not present

## 2023-12-19 DIAGNOSIS — R110 Nausea: Secondary | ICD-10-CM | POA: Diagnosis not present

## 2023-12-19 DIAGNOSIS — R1084 Generalized abdominal pain: Secondary | ICD-10-CM

## 2023-12-19 DIAGNOSIS — R109 Unspecified abdominal pain: Secondary | ICD-10-CM | POA: Diagnosis present

## 2023-12-19 LAB — CBC WITH DIFFERENTIAL/PLATELET
Abs Immature Granulocytes: 0.01 10*3/uL (ref 0.00–0.07)
Basophils Absolute: 0 10*3/uL (ref 0.0–0.1)
Basophils Relative: 1 %
Eosinophils Absolute: 0.1 10*3/uL (ref 0.0–0.5)
Eosinophils Relative: 2 %
HCT: 38.5 % (ref 36.0–46.0)
Hemoglobin: 12.4 g/dL (ref 12.0–15.0)
Immature Granulocytes: 0 %
Lymphocytes Relative: 52 %
Lymphs Abs: 2.6 10*3/uL (ref 0.7–4.0)
MCH: 22.9 pg — ABNORMAL LOW (ref 26.0–34.0)
MCHC: 32.2 g/dL (ref 30.0–36.0)
MCV: 71 fL — ABNORMAL LOW (ref 80.0–100.0)
Monocytes Absolute: 0.3 10*3/uL (ref 0.1–1.0)
Monocytes Relative: 5 %
Neutro Abs: 2 10*3/uL (ref 1.7–7.7)
Neutrophils Relative %: 40 %
Platelets: 275 10*3/uL (ref 150–400)
RBC: 5.42 MIL/uL — ABNORMAL HIGH (ref 3.87–5.11)
RDW: 13.8 % (ref 11.5–15.5)
WBC: 5 10*3/uL (ref 4.0–10.5)
nRBC: 0 % (ref 0.0–0.2)

## 2023-12-19 LAB — COMPREHENSIVE METABOLIC PANEL WITH GFR
ALT: 16 U/L (ref 0–44)
AST: 17 U/L (ref 15–41)
Albumin: 4.1 g/dL (ref 3.5–5.0)
Alkaline Phosphatase: 68 U/L (ref 38–126)
Anion gap: 9 (ref 5–15)
BUN: 9 mg/dL (ref 6–20)
CO2: 22 mmol/L (ref 22–32)
Calcium: 8.9 mg/dL (ref 8.9–10.3)
Chloride: 105 mmol/L (ref 98–111)
Creatinine, Ser: 0.79 mg/dL (ref 0.44–1.00)
GFR, Estimated: >60 mL/min (ref >60)
Glucose, Bld: 89 mg/dL (ref 70–99)
Potassium: 3.4 mmol/L — ABNORMAL LOW (ref 3.5–5.1)
Sodium: 136 mmol/L (ref 135–145)
Total Bilirubin: 1.3 mg/dL — ABNORMAL HIGH (ref 0.0–1.2)
Total Protein: 6.9 g/dL (ref 6.5–8.1)

## 2023-12-19 LAB — URINALYSIS, W/ REFLEX TO CULTURE (INFECTION SUSPECTED)
Bilirubin Urine: NEGATIVE
Glucose, UA: NEGATIVE mg/dL
Hgb urine dipstick: NEGATIVE
Ketones, ur: NEGATIVE mg/dL
Leukocytes,Ua: NEGATIVE
Nitrite: NEGATIVE
Protein, ur: NEGATIVE mg/dL
RBC / HPF: 0 RBC/hpf (ref 0–5)
Specific Gravity, Urine: 1.017 (ref 1.005–1.030)
pH: 5 (ref 5.0–8.0)

## 2023-12-19 LAB — LIPASE, BLOOD: Lipase: 27 U/L (ref 11–51)

## 2023-12-19 LAB — POC URINE PREG, ED: Preg Test, Ur: NEGATIVE

## 2023-12-19 MED ORDER — ONDANSETRON HCL 4 MG/2ML IJ SOLN
4.0000 mg | Freq: Once | INTRAMUSCULAR | Status: AC
  Administered 2023-12-19: 4 mg via INTRAVENOUS
  Filled 2023-12-19: qty 2

## 2023-12-19 MED ORDER — MORPHINE SULFATE (PF) 4 MG/ML IV SOLN
4.0000 mg | Freq: Once | INTRAVENOUS | Status: AC
  Administered 2023-12-19: 4 mg via INTRAVENOUS
  Filled 2023-12-19: qty 1

## 2023-12-19 MED ORDER — IOHEXOL 350 MG/ML SOLN
100.0000 mL | Freq: Once | INTRAVENOUS | Status: AC | PRN
  Administered 2023-12-19: 100 mL via INTRAVENOUS

## 2023-12-19 MED ORDER — ONDANSETRON HCL 4 MG PO TABS: 4.0000 mg | ORAL_TABLET | Freq: Four times a day (QID) | ORAL | 0 refills | Status: AC | PRN

## 2023-12-19 MED ORDER — SODIUM CHLORIDE 0.9 % IV BOLUS
1000.0000 mL | Freq: Once | INTRAVENOUS | Status: AC
  Administered 2023-12-19: 1000 mL via INTRAVENOUS

## 2023-12-19 NOTE — ED Provider Notes (Signed)
 [facility] Provider Note    Event Date/Time   First MD Initiated Contact with Patient 12/19/23 [id number]     (approximate)   History   Abdominal Pain   HPI  Anna Kidd is a 38 year old female presenting to the emergency department for evaluation of abdominal pain.  3 days ago she had onset of nausea worse when eating without vomiting.  No history of abdominal surgeries.  Denies history of similar.  Reports that abdominal pain is diffuse, worse in her left upper quadrant.  Has had some recent diarrhea.  No fevers.  No known sick contacts.     Physical Exam   Triage Vital Signs: ED Triage Vitals  Encounter Vitals Group     BP 12/19/23 0943 (!) 131/90     Systolic BP Percentile --      Diastolic BP Percentile --      Pulse Rate 12/19/23 0943 70     Resp 12/19/23 0943 18     Temp 12/19/23 0943 98.6 F (37 C)     Temp Source 12/19/23 0943 Oral     SpO2 12/19/23 0943 100 %     Weight 12/19/23 0941 165 lb (74.8 kg)     Height 12/19/23 0941 5' 5 (1.651 m)     Head Circumference --      Peak Flow --      Pain Score 12/19/23 0941 8     Pain Loc --      Pain Education --      Exclude from Growth Chart --     Most recent vital signs: Vitals:   12/19/23 0943  BP: (!) 131/90  Pulse: 70  Resp: 18  Temp: 98.6 F (37 C)  SpO2: 100%     General: Awake, interactive  CV:  Regular rate, good peripheral perfusion.  Resp:  Unlabored respirations, lungs clear to auscultation Abd:  Nondistended, soft, tender palpation diffusely most notably in the left upper quadrant and epigastric area Neuro:  Symmetric facial movement, fluid speech   ED Results / Procedures / Treatments   Labs (all labs ordered are listed, but only abnormal results are displayed) Labs Reviewed  CBC WITH DIFFERENTIAL/PLATELET - Abnormal; Notable for the following components:      Result Value   RBC 5.42 (*)    MCV 71.0 (*)    MCH 22.9 (*)    All other components within  normal limits  COMPREHENSIVE METABOLIC PANEL WITH GFR - Abnormal; Notable for the following components:   Potassium 3.4 (*)    Total Bilirubin 1.3 (*)    All other components within normal limits  URINALYSIS, W/ REFLEX TO CULTURE (INFECTION SUSPECTED) - Abnormal; Notable for the following components:   Color, Urine YELLOW (*)    APPearance CLEAR (*)    Bacteria, UA RARE (*)    All other components within normal limits  LIPASE, BLOOD  POC URINE PREG, ED     EKG EKG independently reviewed interpreted by myself (ER attending) demonstrates:    RADIOLOGY Imaging independently reviewed and interpreted by myself demonstrates:  CT A/P with wall thickening possibly reflective of colitis  Formal Radiology Read:  CT ABDOMEN PELVIS W CONTRAST Result Date: 12/19/2023 CLINICAL DATA:  Left upper quadrant abdominal pain. EXAM: CT ABDOMEN AND PELVIS WITH CONTRAST TECHNIQUE: Multidetector CT imaging of the abdomen and pelvis was performed using the standard protocol following bolus administration of intravenous contrast. RADIATION DOSE REDUCTION: This exam was performed according to the departmental  dose-optimization program which includes automated exposure control, adjustment of the mA and/or kV according to patient size and/or use of iterative reconstruction technique. CONTRAST:  OMNIPAQUE  IOHEXOL  350 MG/ML SOLN COMPARISON:  CT abdomen/pelvis dated 09/17/2022. FINDINGS: Lower chest: No acute abnormality. Hepatobiliary: Stable subcentimeter focal hypodensity in the right hepatic lobe, too small to definitively characterize. No gallstones, gallbladder wall thickening, or biliary dilatation. Pancreas: Unremarkable. No pancreatic ductal dilatation or surrounding inflammatory changes. Spleen: Normal in size without focal abnormality. Adrenals/Urinary Tract: Adrenal glands are unremarkable. Kidneys are normal, without renal calculi, focal lesion, or hydronephrosis. Bladder is unremarkable. Stomach/Bowel:  Stomach is within normal limits. Appendix appears normal. No obstruction. Wall thickening of the distal transverse, descending, and sigmoid colon, could be secondary to underdistention or a nonspecific colitis. No significant surrounding pericolonic fat stranding. Vascular/Lymphatic: No significant vascular findings are present. No enlarged abdominal or pelvic lymph nodes. Reproductive: Uterus and bilateral adnexa are unremarkable. Other: No significant abdominopelvic ascites. No intraperitoneal free air. Small fat containing umbilical hernia. Musculoskeletal: No acute or significant osseous findings. IMPRESSION: Wall thickening of the distal transverse, descending, and sigmoid colon, could be secondary to underdistention or a mild nonspecific colitis. Electronically Signed   By: Harrietta Sherry M.D.   On: 12/19/2023 12:12    PROCEDURES:  Critical Care performed: No  Procedures   MEDICATIONS ORDERED IN ED: Medications  ondansetron  (ZOFRAN ) injection 4 mg (has no administration in time range)  sodium chloride  0.9 % bolus 1,000 mL (1,000 mLs Intravenous New Bag/Given 12/19/23 1021)  morphine  (PF) 4 MG/ML injection 4 mg (4 mg Intravenous Given 12/19/23 1020)  ondansetron  (ZOFRAN ) injection 4 mg (4 mg Intravenous Given 12/19/23 1020)  iohexol  (OMNIPAQUE ) 350 MG/ML injection 100 mL (100 mLs Intravenous Contrast Given 12/19/23 1103)     IMPRESSION / MDM / ASSESSMENT AND PLAN / ED COURSE  I reviewed the triage vital signs and the nursing notes.  Differential diagnosis includes, but is not limited to, pancreatitis, colitis, biliary pathology, other acute intrabdominal process, gastritis, constipation  Patient's presentation is most consistent with acute presentation with potential threat to life or bodily function.  38 year old presenting with abdominal pain.  Will obtain labs, CT to further evaluate.  Ordered for morphine , Zofran , IV fluids for symptomatic treatment.  12:28 PM Labs overall  reassuring.  Urine without evidence of infection.  CT with questionable colitis.  Patient reassessed.  Feeling improved after receiving medications here.  Reviewed results of workup.  She is comfortable with discharge home.  With small-volume diarrhea no vomiting, did discuss holding off on antibiotics and patient is comfortable with this.  Will DC with prescription for nausea medication.  Strict return precautions provided.  Patient discharged in stable condition.      FINAL CLINICAL IMPRESSION(S) / ED DIAGNOSES   Final diagnoses:  Generalized abdominal pain  Nausea     Rx / DC Orders   ED Discharge Orders          Ordered    ondansetron  (ZOFRAN ) 4 MG tablet  Every 6 hours PRN        12/19/23 1227             Note:  This document was prepared using Dragon voice recognition software and may include unintentional dictation errors.   Levander Slate, MD 12/19/23 1228

## 2023-12-19 NOTE — Discharge Instructions (Signed)
 You were seen in the Emergency Department today for evaluation of your abdominal pain. Fortunately, your labs, urine test, and CT scan were overall reassuring against an emergency cause for your pain.  Your symptoms may be related to inflammation of your colon known as colitis.  Please follow-up with your primary care doctor within for reevaluation. Return to the ER for any new or worsening symptoms including worsening pain, inability to tolerate food or liquids, or any other new or concerning symptoms.

## 2023-12-19 NOTE — ED Triage Notes (Signed)
 Pt via POV from home. Pt c/o LUQ pain that started 3 days ago, reports every time she eats she gets nauseous. Pt still has her gallbladder. Pt is A&Ox4 and NAD, ambulatory to triage.

## 2024-06-10 ENCOUNTER — Encounter (HOSPITAL_COMMUNITY): Payer: Self-pay | Admitting: Obstetrics and Gynecology

## 2024-06-10 NOTE — Progress Notes (Addendum)
 Spoke w/ via phone for pre-op interview--- pt Lab needs dos----   upt     (per anes) Lab results------  lab appt 06-21-2024 @ 0900 getting CBC/ T&S (per anes) COVID test -----patient states asymptomatic no test needed Arrive at ------- 0530 on 06-22-2024 NPO after MN w/ exception sips of water w/ meds Pre-Surgery Ensure or G2: n/a  Med rec completed Medications to take morning of surgery ----- lexapro, asmanex inhaler/ if needed allegra, rescue inhalers Diabetic medication ----- n/a  GLP1 agonist last dose: n/a GLP1 instructions:  Patient instructed no nail polish to be worn day of surgery Patient instructed to bring photo id and insurance card day of surgery Patient aware to have Driver (ride ) / caregiver    for 24 hours after surgery - mother, gwendolyn horne  Patient Special Instructions ----- will pick up soap and written instructions at lap apt Pre-Op special Instructions -----  pt verbalized understanding today last dose of weight loss medication , qsymia, today 06-10-2024 prior to surgery. Sent inbox message in epic to Dr Rosalva on 06-09-2024, requested pre-op orders   Patient verbalized understanding of instructions that were given at this phone interview. Patient denies chest pain, sob, fever, cough at the interview.

## 2024-06-10 NOTE — Pre-Procedure Instructions (Signed)
 Surgical Instructions  Your procedure is scheduled on :  Tuesday,  06-22-2024 Report to [facility] Main Entrance A at 5:30 AM, then check in the Admitting office. Any questions or running late day of surgery :  call [phone]  Questions prior to your surgery day:  call [phone], Monday -- Friday 8am - 4pm. If you experience any cold or flu symptoms such as cough, fever, chills, shortness of breath, etc. between now and you scheduled surgery, please notify your surgeon office.   Remember: Do Not eat any food and Do Not drink any liquids any liquids after midnight night before surgery.   This includes No water,  candy,  gum, and mints.  Take these medicines the morning of surgery with A SIPS OF WATER:   Escitalopram (lexapro) Mometasone furoate (asmanex) inhaler   May take these medicines IF NEEDED:   Fexofenadine (allegra) Albuterol sulfate (proair) inhaler Albuterol-budesonide inhaler ~~~Please bring both rescue inhalers with out day of surgery   One week prior to surgery, STOP taking any Aspirin (unless otherwise instructed by your surgeon) Aleve , Naproxen , ibuprofen, Motrin, Advil, Goody's, BC's, all herbal medications/ supplements, fish oil, and non-prescription vitamins.  Do NOT Smoke (tobacco/ vaping) and Do Not drink alcohol for 24 hours prior to your procedure.  For those patients that use a CPAP.  Please bring your CPAP/ mask/ tubing with them day of surgery . Anesthesia may ask recovery room nurse to use and if you stay the night you be asked to use it.  You will be asked to removed any contacts, glasses, piercing's, hearing aid's, dentures/ partials prior to surgery.  Please bring cases/ container/ solution/ etc., for them day of surgery.   Patients discharged the day of surgery will NOT be allowed to drive home.  You must have responsible driver and caregiver to stay at home with you the next 24 hours.  SURGICAL WAITING ROOM VISITATION Patients may have no more  than 2 support people in the waiting area - if more than 2 , these visitors may rotate.  Pre-op nurse will coordinate an appropriate time for 1 Adult support person, who may not rotate, to accompany patient in pre-op.  Aware some patients may have certain circumstances, speak to pre-op nurse day of surgery.  Children under the age 39 must have an adult with them who is not the patient and must remain in the main waiting area with an adult.  If the patient needs to stay at the hospital during part of their recovery, the visitor guidelines for inpatient rooms apply.  Please refer to the [facility] website for the visitor guidelines for any additional information.  If you received a COVID test during your pre-op visit it is requested that you wear a mask when out in public, stay away from anyone that may not be feeling well and notify your surgeon if you develop symptoms.  If you have been in contact with anyone that has tested positive in the past 10 days notify your surgeon.     [facility] - Preparing for Surgery  Before surgery, you can play an important role. Because skin is not sterile, it needs to be as free of germs as possible. You can reduce the number of germs on your skin by washing with CHG (chlorhexidine gluconate) soap before surgery. CHG is an antiseptic cleaner which kills germs and bonds with the skin to continue killing germs even after washing. Oral hygiene is also important in reducing the risk of infection.  Remember to brush your teeth with your regular toothpaste the morning of surgery.  Please DO NOT use if you have an allergy to CHG or antibacterial soaps. If your skin becomes reddened/irritated stop using the CHG and inform your Pre-op nurse day of surgery.  DO NOT shave (including legs and genital area) for at least 48 hours prior to your CHG shower.   Please follow these instructions carefully:  Shower with CHG soap the night before surgery. If you choose to wash your  hair, wash your hair first as usual with your normal shampoo. After you shampoo, rinse your hair and body thoroughly to remove the shampoo. Use CHG as you would any other liquid soap. You can apply CHG directly to the skin and wash gently with a clean washcloth or shower sponge. Apply the CHG soap to your body ONLY FROM THE NECK DOWN. Do not use on open wounds or open sores. Avoid contact with your eyes, ears, mouth, and genitals (private parts). Wash genitals (private parts) with your normal soap. Wash thoroughly, paying special attention to the area where your surgery will be performed. Thoroughly rinse your body with warm water from the neck down. DO NOT shower/wash with your normal soap after using and rinsing off the CHG soap. DO NOT use lotions, oils, etc., after showering with CHG. Pat yourself dry with a clean towel. Wear clean pajamas. Place clean sheets on your bed the night of your CHG shower and do not sleep with pets.  Day of Surgery  DO NOT Apply any lotions,  powder,  oils,  deodorants (may use underarm deodorant),  cologne/  perfumes  or makeup Do Not wear jewelry /  piercing's/  metal/  permanent jewelry must be removed prior to arrival day of surgery. (No plastic piercing) Do Not wear nail polish,  gel polish,  artificial nails, or any other type of covering on natural finger nails (toe nails are okay) Remember to brush your teeth and rinse mouth out. Put on clean / comfortable clothes. [facility] is not responsible for valuables/ personal belongings

## 2024-06-20 ENCOUNTER — Other Ambulatory Visit: Payer: Self-pay | Admitting: Obstetrics and Gynecology

## 2024-06-20 DIAGNOSIS — N920 Excessive and frequent menstruation with regular cycle: Secondary | ICD-10-CM

## 2024-06-20 NOTE — H&P (Signed)
   The note originally documented on this encounter has been moved the the encounter in which it belongs.

## 2024-06-20 NOTE — H&P (Signed)
 [facility] for Appointment   1.  Preop visit History of Present Illness    General:  38 y/o presents for pre-op visit. Pt is schedule for a robotic assisted laparoscopic hysterectomy with bilateral salpingectomy on 06/22/2024 for the management of menorrhagia.   Pelvic u/s 06/17/2023 Uterus measures 6.2 cm x 2.4 cm x 2.7 cm. The ovaries appeared normal bilaterally.   IN REVIEW: She has osteopenia on DEXa scan in multiple areas most likely due to prolonged Depo-Provera  use. She has been on Depo-Provera  since the age of 27. She has history of anemia due to menorrhagia prior to starting Depo-Provera . She has tried IUD in the past and had infections. She can not remember to take birth control pills.   SHe has been off the depo for a year. Her last menses was 05/17/2024 lasting 14 days.  On her heaviest day she changed a pad every 2 hours on her heaviest day. SHe reports  10 days were considered heavy.  Current Medications Taking Albuterol-Budesonide 90-80 MCG/ACT Aerosol 2 puffs as needed Inhalation Six times a day Escitalopram Oxalate 20 MG Tablet 1 TABLET ORALLY ONCE A DAY FOR ANXIETY MANAGEMENT 90 DAYS Norethindrone 0.35 MG Tablet 1 tablet Orally Once a day Allegra 180 MG Tablet 1 tablet as needed Orally Once a day Asmanex HFA 100 MCG/ACT Aerosol 2 puffs Inhalation twice a day Emgality(Galcanezumab-gnlm) 120 MG/ML Solution Prefilled Syringe 1 mL Subcutaneous once a month for migraine management Ferrous Sulfate 325 (65 Fe) MG Tablet 1 tablet Orally every other day Linzess(linaCLOtide) 145 MCG Capsule 1 capsule at least 30 minutes before the first meal of the day on an empty stomach Orally Once a day for constipation Magnesium 250 MG Tablet 1 tab at bedtime Orally Once a day ProAir HFA 108 (90 Base) MCG/ACT Aerosol Solution 1 puff as needed Inhalation every 4 hrs (with asthma flare) Vitamin D3 1000 UNIT Capsule 1 capsule Orally Once a day Not-Taking Qsymia(Phentermine-Topiramate ER) 11.25-69 MG  Capsule Extended Release 24 Hour 1 capsule Orally Once a day Medication List reviewed and reconciled with the patient Past Medical History Asthma/ Allergies. Anemia. migraine HAs w/ visual aura. Chronic constipation ([facility])/ IBS. Anxiety (therapist Mart Daring (independent). Surgical History None Family History Father: deceased 65 yrs, murdered Mother: alive 3 yrs, Asthma, obesity, diagnosed with Hypertension Paternal Grand Father: deceased Paternal Grand Mother: deceased, Colon cancer, diagnosed with Colon cancer Maternal Grand Father: deceased Maternal Grand Mother: deceased Only child, No Family History of Polyps, or Liver Disease. Paternal Grandmother passed away from colon cancer. no known hx of breast, ovarian, or uterine cancers. Social History    General:  Tobacco use cigarettes: Never smoked, Tobacco history last updated 06/11/2024, Vaping No. EXPOSURE TO PASSIVE SMOKE: no. Alcohol: no. Caffeine: yes, occasionally. Recreational drug use: no. Exercise: nothing structured. DENTAL CARE: good. Marital Status: single. Children: 1 son Honey). EDUCATION: BS: Criminal Justice / masters in school counseling. 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 COMMUNICATION BARRIERS: none. Gyn History Sexual activity not currently sexually active.  Periods : irregular.  LMP 05/17/2024.  Birth control condoms, OCP.  Last pap smear date 04/2021 NILM, HRHPV neg.  Denies H/O Last mammogram date.  H/O Abnormal pap smear treated with LEEP.  STD HPV.  Menarche 11.  OB History abortion  1.  miscarriages  1.  Number of pregnancies  3.  Pregnancy # 1  live birth, vaginal delivery, boy, 07/2006.  Pregnancy # 2  miscarriage, 2011.  Pregnancy # 3  abortion, 2011.  Allergies Aimovig:  hives - Allergy Doxycycline: GI upset/ nausea and abd pain - Allergy Hospitalization/Major Diagnostic Procedure Pneumonia 2005 Childbirth 2007 Review of Systems    CONSTITUTIONAL:  Chills No. Fatigue No.  Fever No. Night sweats No. Recent travel outside [place]  No. Sweats No. Weight change No.     OPHTHALMOLOGY:  Blurring of vision no. Change in vision no. Double vision no.     ENT:  Dizziness no. Nose bleeds no. Sore throat no. Teeth pain no.     ALLERGY:  Hives no.     CARDIOLOGY:  Chest pain no. High blood pressure no. Irregular heart beat no. Leg edema no. Palpitations no.     RESPIRATORY:  Shortness of breath no. Cough no. Wheezing no.     UROLOGY:  Pain with urination no. Urinary urgency no. Urinary frequency no. Urinary incontinence no. Difficulty urinating No. Blood in urine No.     GASTROENTEROLOGY:  Abdominal pain no. Appetite change no. Bloating/belching no. Blood in stool or on toilet paper no. Change in bowel movements no. Constipation no. Diarrhea no. Difficulty swallowing no. Nausea no.     FEMALE REPRODUCTIVE:  Vulvar pain no. Vulvar rash no. Abnormal vaginal bleeding , heavy menses, irregular menses. Breast pain no. Nipple discharge no. Pain with intercourse no. Pelvic pain no. Unusual vaginal discharge no. Vaginal itching no.     MUSCULOSKELETAL:  Muscle aches no.     NEUROLOGY:  Headache no. Tingling/numbness no. Weakness no.     PSYCHOLOGY:  Depression no. Anxiety no. Nervousness no. Sleep disturbances no. Suicidal ideation no .     ENDOCRINOLOGY:  Excessive thirst no. Excessive urination no. Hair loss no. Heat or cold intolerance no.     HEMATOLOGY/LYMPH:  Abnormal bleeding no. Easy bruising no. Swollen glands no.     DERMATOLOGY:  New/changing skin lesion no. Rash no. Sores no.  Vital Signs Wt: 168.6, Wt change: -2.3 lbs, Ht: 66, BMI: 27.21, Pulse sitting: 84, BP sitting: 100/69. Examination    General Examination: CONSTITUTIONAL: alert, oriented, NAD.  SKIN:  moist, warm.  EYES:  Conjunctiva clear.  LUNGS: good I:E efffort noted, clear to auscultation bilaterally.  HEART: regular rate and rhythm.  ABDOMEN: soft, non-tender/non-distended, bowel sounds present.   FEMALE GENITOURINARY: normal external genitalia, labia - unremarkable, vagina - pink moist mucosa, no lesions or abnormal discharge, cervix - no discharge or lesions or CMT, adnexa - no masses or tenderness, uterus - nontender and normal size on palpation.  EXTREMITIES: no edema present.  PSYCH:  affect normal, good eye contact.  Physical Examination    Chaperone present:  Chaperone present Sammye Gory 06/11/2024 09:37:48 AM >, for pelvic exam.  Assessments 1. Menorrhagia - N92.0 (Primary)    2. Anemia - D64.9    Treatment 1. Menorrhagia      LAB: CBC without Diff (Collection Date & Time - 06/11/2024 09:43 AM)     Notes: Pt is scheduled for robotic assisted laparoscopic hysterectomy w/ bilateral salpingectomy for management of menorrhagia. Pt advised she can stay overnight, however can leave same day if she would like. She is advised that she will have to stay 2 nights in the hospital if conversion to larger incision. She is advised that in order to be discharged from hospital, she will need to be able to ambulate, urinate, tolerate food and pain must be controlled wit oral medication. . Discussed w/ pt risks of hysterectomy including but not limited to infection/bleeding, conversion to larger/abdominal incision, damage to bowel, bladder, ureters and surrounding organs,  with the need for further surgery. Discussed risk of blood transfusion and risk of HIV or hep B&C with blood transfusion. Pt is aware of risks and desires blood transfusion if needed. Pt advised to avoid NSAIDs (Asprin, Aleve , Advil, Ibuprofen, Motrin, Excedrin) between now and surgery given risk of bleeding during surgery. She may take Tylenol  for pain management. She is advised to avoid eating or drinking starting midnight prior to surgery. Discussed post-surgery avoidance of driving for 1 week and lifting weight greater than 10 lbs or intercourse for 6-8 weeks.   2. Anemia      LAB: CBC without Diff (Collection Date & Time -  06/11/2024 09:43 AM)     Notes: Pt is scheduled for robotic assisted laparoscopic hysterectomy w/ bilateral salpingectomy for management of menorrhagia. Pt advised she can stay overnight, however can leave same day if she would like. She is advised that she will have to stay 2 nights in the hospital if conversion to larger incision. She is advised that in order to be discharged from hospital, she will need to be able to ambulate, urinate, tolerate food and pain must be controlled wit oral medication. . Discussed w/ pt risks of hysterectomy including but not limited to infection/bleeding, conversion to larger/abdominal incision, damage to bowel, bladder, ureters and surrounding organs, with the need for further surgery. Discussed risk of blood transfusion and risk of HIV or hep B&C with blood transfusion. Pt is aware of risks and desires blood transfusion if needed. Pt advised to avoid NSAIDs (Asprin, Aleve , Advil, Ibuprofen, Motrin, Excedrin) between now and surgery given risk of bleeding during surgery. She may take Tylenol  for pain management. She is advised to avoid eating or drinking starting midnight prior to surgery. Discussed post-surgery avoidance of driving for 1 week and lifting weight greater than 10 lbs or intercourse for 6-8 weeks.

## 2024-06-21 ENCOUNTER — Encounter (HOSPITAL_COMMUNITY)
Admission: RE | Admit: 2024-06-21 | Discharge: 2024-06-21 | Disposition: A | Discharge status: Home / Self Care | Source: Ambulatory Visit | Attending: Obstetrics and Gynecology | Admitting: Obstetrics and Gynecology

## 2024-06-21 DIAGNOSIS — N920 Excessive and frequent menstruation with regular cycle: Secondary | ICD-10-CM | POA: Diagnosis not present

## 2024-06-21 DIAGNOSIS — Z01812 Encounter for preprocedural laboratory examination: Secondary | ICD-10-CM | POA: Insufficient documentation

## 2024-06-21 LAB — TYPE AND SCREEN
ABO/RH(D): A POS
Antibody Screen: NEGATIVE

## 2024-06-21 LAB — CBC
HCT: 38.9 % (ref 36.0–46.0)
Hemoglobin: 12.3 g/dL (ref 12.0–15.0)
MCH: 22.5 pg — ABNORMAL LOW (ref 26.0–34.0)
MCHC: 31.6 g/dL (ref 30.0–36.0)
MCV: 71.2 fL — ABNORMAL LOW (ref 80.0–100.0)
Platelets: 264 K/uL (ref 150–400)
RBC: 5.46 MIL/uL — ABNORMAL HIGH (ref 3.87–5.11)
RDW: 13.7 % (ref 11.5–15.5)
WBC: 4.8 K/uL (ref 4.0–10.5)
nRBC: 0 % (ref 0.0–0.2)

## 2024-06-22 ENCOUNTER — Observation Stay (HOSPITAL_COMMUNITY): Admitting: Anesthesiology

## 2024-06-22 ENCOUNTER — Encounter (HOSPITAL_COMMUNITY)
Admission: RE | Disposition: A | Discharge status: Home / Self Care | Payer: Self-pay | Source: Home / Self Care | Attending: Obstetrics and Gynecology

## 2024-06-22 ENCOUNTER — Encounter (HOSPITAL_COMMUNITY): Payer: Self-pay | Admitting: Obstetrics and Gynecology

## 2024-06-22 ENCOUNTER — Other Ambulatory Visit: Payer: Self-pay

## 2024-06-22 ENCOUNTER — Observation Stay (HOSPITAL_COMMUNITY)
Admission: RE | Admit: 2024-06-22 | Discharge: 2024-06-22 | Disposition: A | Discharge status: Home / Self Care | Payer: BC Managed Care – PPO | Attending: Obstetrics and Gynecology | Admitting: Obstetrics and Gynecology

## 2024-06-22 DIAGNOSIS — J45909 Unspecified asthma, uncomplicated: Secondary | ICD-10-CM | POA: Diagnosis not present

## 2024-06-22 DIAGNOSIS — N920 Excessive and frequent menstruation with regular cycle: Secondary | ICD-10-CM | POA: Diagnosis present

## 2024-06-22 DIAGNOSIS — Z90710 Acquired absence of both cervix and uterus: Secondary | ICD-10-CM | POA: Diagnosis not present

## 2024-06-22 DIAGNOSIS — D649 Anemia, unspecified: Secondary | ICD-10-CM | POA: Diagnosis not present

## 2024-06-22 DIAGNOSIS — N72 Inflammatory disease of cervix uteri: Principal | ICD-10-CM | POA: Insufficient documentation

## 2024-06-22 DIAGNOSIS — Z01818 Encounter for other preprocedural examination: Principal | ICD-10-CM

## 2024-06-22 HISTORY — DX: Vitamin D deficiency, unspecified: E55.9

## 2024-06-22 HISTORY — DX: Excessive and frequent menstruation with regular cycle: N92.0

## 2024-06-22 HISTORY — DX: Migraine with aura, not intractable, without status migrainosus: G43.109

## 2024-06-22 HISTORY — DX: Anxiety disorder, unspecified: F41.9

## 2024-06-22 HISTORY — DX: Irritable bowel syndrome with constipation: K58.1

## 2024-06-22 HISTORY — DX: Presence of spectacles and contact lenses: Z97.3

## 2024-06-22 HISTORY — DX: Other seasonal allergic rhinitis: J30.2

## 2024-06-22 HISTORY — DX: Iron deficiency anemia secondary to blood loss (chronic): D50.0

## 2024-06-22 LAB — ABO/RH: ABO/RH(D): A POS

## 2024-06-22 LAB — POCT PREGNANCY, URINE: Preg Test, Ur: NEGATIVE

## 2024-06-22 SURGERY — HYSTERECTOMY, TOTAL, ROBOT-ASSISTED, LAPAROSCOPIC, WITH BILATERAL SALPINGO-OOPHORECTOMY
Anesthesia: General | Site: Pelvis | Laterality: Bilateral

## 2024-06-22 MED ORDER — ROCURONIUM BROMIDE 10 MG/ML (PF) SYRINGE
PREFILLED_SYRINGE | INTRAVENOUS | Status: AC
  Filled 2024-06-22: qty 10

## 2024-06-22 MED ORDER — KETOROLAC TROMETHAMINE 30 MG/ML IJ SOLN
INTRAMUSCULAR | Status: AC
  Filled 2024-06-22: qty 1

## 2024-06-22 MED ORDER — ONDANSETRON HCL 4 MG/2ML IJ SOLN
INTRAMUSCULAR | Status: AC
  Filled 2024-06-22: qty 2

## 2024-06-22 MED ORDER — OXYCODONE HCL 5 MG PO TABS: 5.0000 mg | ORAL_TABLET | Freq: Four times a day (QID) | ORAL | 0 refills | Status: AC | PRN

## 2024-06-22 MED ORDER — SCOPOLAMINE 1 MG/3DAYS TD PT72
MEDICATED_PATCH | TRANSDERMAL | Status: AC
  Filled 2024-06-22: qty 1

## 2024-06-22 MED ORDER — FENTANYL CITRATE (PF) 100 MCG/2ML IJ SOLN
25.0000 ug | INTRAMUSCULAR | Status: DC | PRN
  Administered 2024-06-22: 50 ug via INTRAVENOUS

## 2024-06-22 MED ORDER — ACETAMINOPHEN 500 MG PO TABS
ORAL_TABLET | ORAL | Status: AC
  Filled 2024-06-22: qty 2

## 2024-06-22 MED ORDER — LACTATED RINGERS IV SOLN: INTRAVENOUS | Status: DC

## 2024-06-22 MED ORDER — FENTANYL CITRATE (PF) 100 MCG/2ML IJ SOLN
INTRAMUSCULAR | Status: AC
  Filled 2024-06-22: qty 2

## 2024-06-22 MED ORDER — 0.9 % SODIUM CHLORIDE (POUR BTL) OPTIME
TOPICAL | Status: DC | PRN
  Administered 2024-06-22: 1000 mL

## 2024-06-22 MED ORDER — LIDOCAINE 2% (20 MG/ML) 5 ML SYRINGE
INTRAMUSCULAR | Status: DC | PRN
  Administered 2024-06-22: 60 mg via INTRAVENOUS

## 2024-06-22 MED ORDER — PROPOFOL 10 MG/ML IV BOLUS
INTRAVENOUS | Status: DC | PRN
  Administered 2024-06-22: 200 mg via INTRAVENOUS

## 2024-06-22 MED ORDER — SODIUM CHLORIDE 0.9 % IV SOLN
2.0000 g | INTRAVENOUS | Status: AC
  Administered 2024-06-22: 2 g via INTRAVENOUS
  Filled 2024-06-22: qty 2

## 2024-06-22 MED ORDER — SODIUM CHLORIDE (PF) 0.9 % IJ SOLN
INTRAMUSCULAR | Status: AC
  Filled 2024-06-22: qty 50

## 2024-06-22 MED ORDER — PHENYLEPHRINE HCL-NACL 20-0.9 MG/250ML-% IV SOLN
INTRAVENOUS | Status: DC | PRN
  Administered 2024-06-22: 15 ug/min via INTRAVENOUS

## 2024-06-22 MED ORDER — BUPIVACAINE LIPOSOME 1.3 % IJ SUSP
INTRAMUSCULAR | Status: AC
Start: 2024-06-22 — End: 2024-06-22
  Filled 2024-06-22: qty 20

## 2024-06-22 MED ORDER — OXYCODONE HCL 5 MG PO TABS: 5.0000 mg | ORAL_TABLET | ORAL | Status: DC | PRN

## 2024-06-22 MED ORDER — FENTANYL CITRATE (PF) 250 MCG/5ML IJ SOLN
INTRAMUSCULAR | Status: DC | PRN
  Administered 2024-06-22: 100 ug via INTRAVENOUS
  Administered 2024-06-22: 50 ug via INTRAVENOUS
  Administered 2024-06-22: 100 ug via INTRAVENOUS

## 2024-06-22 MED ORDER — ACETAMINOPHEN 500 MG PO TABS
1000.0000 mg | ORAL_TABLET | ORAL | Status: AC
  Administered 2024-06-22: 1000 mg via ORAL

## 2024-06-22 MED ORDER — KETOROLAC TROMETHAMINE 30 MG/ML IJ SOLN
INTRAMUSCULAR | Status: DC | PRN
Start: 2024-06-22 — End: 2024-06-22
  Administered 2024-06-22: 30 mg via INTRAVENOUS

## 2024-06-22 MED ORDER — GABAPENTIN 300 MG PO CAPS
ORAL_CAPSULE | ORAL | Status: AC
  Filled 2024-06-22: qty 1

## 2024-06-22 MED ORDER — MENTHOL 3 MG MT LOZG: 1.0000 | LOZENGE | OROMUCOSAL | Status: DC | PRN

## 2024-06-22 MED ORDER — ROPIVACAINE HCL 5 MG/ML IJ SOLN
INTRAMUSCULAR | Status: DC | PRN
  Administered 2024-06-22: 60 mL

## 2024-06-22 MED ORDER — OXYCODONE HCL 5 MG PO TABS: 5.0000 mg | ORAL_TABLET | Freq: Once | ORAL | Status: DC | PRN

## 2024-06-22 MED ORDER — ONDANSETRON HCL 4 MG/2ML IJ SOLN: 4.0000 mg | Freq: Four times a day (QID) | INTRAMUSCULAR | Status: DC | PRN

## 2024-06-22 MED ORDER — MIDAZOLAM HCL (PF) 2 MG/2ML IJ SOLN
INTRAMUSCULAR | Status: DC | PRN
Start: 2024-06-22 — End: 2024-06-22
  Administered 2024-06-22: 2 mg via INTRAVENOUS

## 2024-06-22 MED ORDER — AMISULPRIDE (ANTIEMETIC) 5 MG/2ML IV SOLN: 10.0000 mg | Freq: Once | INTRAVENOUS | Status: DC | PRN

## 2024-06-22 MED ORDER — SCOPOLAMINE 1 MG/3DAYS TD PT72
MEDICATED_PATCH | TRANSDERMAL | Status: DC | PRN
  Administered 2024-06-22: 1 via TRANSDERMAL

## 2024-06-22 MED ORDER — SUGAMMADEX SODIUM 200 MG/2ML IV SOLN
INTRAVENOUS | Status: DC | PRN
  Administered 2024-06-22: 300 mg via INTRAVENOUS

## 2024-06-22 MED ORDER — SODIUM CHLORIDE 0.9 % IR SOLN
Status: DC | PRN
  Administered 2024-06-22: 1000 mL

## 2024-06-22 MED ORDER — LACTATED RINGERS IV SOLN
INTRAVENOUS | Status: DC
Start: 2024-06-22 — End: 2024-06-22

## 2024-06-22 MED ORDER — FENTANYL CITRATE (PF) 250 MCG/5ML IJ SOLN
INTRAMUSCULAR | Status: AC
  Filled 2024-06-22: qty 5

## 2024-06-22 MED ORDER — ACETAMINOPHEN 500 MG PO TABS
1000.0000 mg | ORAL_TABLET | Freq: Four times a day (QID) | ORAL | Status: DC
  Administered 2024-06-22: 1000 mg via ORAL
  Filled 2024-06-22: qty 2

## 2024-06-22 MED ORDER — METHYLENE BLUE 20 MG/2ML IV SOSY
PREFILLED_SYRINGE | INTRAVENOUS | Status: AC
  Filled 2024-06-22: qty 2

## 2024-06-22 MED ORDER — ALBUTEROL SULFATE (2.5 MG/3ML) 0.083% IN NEBU: 2.5000 mL | INHALATION_SOLUTION | RESPIRATORY_TRACT | Status: DC | PRN

## 2024-06-22 MED ORDER — PROPOFOL 10 MG/ML IV BOLUS
INTRAVENOUS | Status: AC
  Filled 2024-06-22: qty 20

## 2024-06-22 MED ORDER — GABAPENTIN 300 MG PO CAPS
300.0000 mg | ORAL_CAPSULE | ORAL | Status: AC
  Administered 2024-06-22: 300 mg via ORAL

## 2024-06-22 MED ORDER — BUPIVACAINE LIPOSOME 1.3 % IJ SUSP
INTRAMUSCULAR | Status: DC | PRN
  Administered 2024-06-22: 50 mL

## 2024-06-22 MED ORDER — ROPIVACAINE HCL 5 MG/ML IJ SOLN
INTRAMUSCULAR | Status: AC
  Filled 2024-06-22: qty 30

## 2024-06-22 MED ORDER — IBUPROFEN 600 MG PO TABS: 600.0000 mg | ORAL_TABLET | Freq: Four times a day (QID) | ORAL | 1 refills | Status: AC | PRN

## 2024-06-22 MED ORDER — ACETAMINOPHEN 500 MG PO TABS: 1000.0000 mg | ORAL_TABLET | Freq: Three times a day (TID) | ORAL | 0 refills | Status: AC | PRN

## 2024-06-22 MED ORDER — DEXAMETHASONE SOD PHOSPHATE PF 10 MG/ML IJ SOLN
INTRAMUSCULAR | Status: DC | PRN
  Administered 2024-06-22: 10 mg via INTRAVENOUS

## 2024-06-22 MED ORDER — LIDOCAINE 2% (20 MG/ML) 5 ML SYRINGE
INTRAMUSCULAR | Status: AC
  Filled 2024-06-22: qty 5

## 2024-06-22 MED ORDER — ONDANSETRON HCL 4 MG PO TABS: 4.0000 mg | ORAL_TABLET | Freq: Four times a day (QID) | ORAL | Status: DC | PRN

## 2024-06-22 MED ORDER — HYDROMORPHONE HCL 1 MG/ML IJ SOLN: 0.2000 mg | INTRAMUSCULAR | Status: DC | PRN

## 2024-06-22 MED ORDER — CHLORHEXIDINE GLUCONATE 0.12 % MT SOLN
OROMUCOSAL | Status: AC
  Filled 2024-06-22: qty 15

## 2024-06-22 MED ORDER — ORAL CARE MOUTH RINSE: 15.0000 mL | Freq: Once | OROMUCOSAL | Status: AC

## 2024-06-22 MED ORDER — SENNA 8.6 MG PO TABS
1.0000 | ORAL_TABLET | Freq: Two times a day (BID) | ORAL | Status: DC
  Administered 2024-06-22: 8.6 mg via ORAL
  Filled 2024-06-22: qty 1

## 2024-06-22 MED ORDER — ROCURONIUM BROMIDE 10 MG/ML (PF) SYRINGE
PREFILLED_SYRINGE | INTRAVENOUS | Status: DC | PRN
  Administered 2024-06-22 (×2): 50 mg via INTRAVENOUS

## 2024-06-22 MED ORDER — OXYCODONE HCL 5 MG/5ML PO SOLN: 5.0000 mg | Freq: Once | ORAL | Status: DC | PRN

## 2024-06-22 MED ORDER — BUPIVACAINE HCL (PF) 0.25 % IJ SOLN
INTRAMUSCULAR | Status: AC
Start: 2024-06-22 — End: 2024-06-22
  Filled 2024-06-22: qty 30

## 2024-06-22 MED ORDER — CHLORHEXIDINE GLUCONATE 0.12 % MT SOLN
15.0000 mL | Freq: Once | OROMUCOSAL | Status: AC
  Administered 2024-06-22: 15 mL via OROMUCOSAL

## 2024-06-22 MED ORDER — ALUM & MAG HYDROXIDE-SIMETH 200-200-20 MG/5ML PO SUSP: 30.0000 mL | ORAL | Status: DC | PRN

## 2024-06-22 MED ORDER — MIDAZOLAM HCL 2 MG/2ML IJ SOLN
INTRAMUSCULAR | Status: AC
  Filled 2024-06-22: qty 2

## 2024-06-22 MED ORDER — IBUPROFEN 600 MG PO TABS
600.0000 mg | ORAL_TABLET | Freq: Four times a day (QID) | ORAL | Status: DC
  Administered 2024-06-22: 600 mg via ORAL
  Filled 2024-06-22: qty 1

## 2024-06-22 MED ORDER — ZOLPIDEM TARTRATE 5 MG PO TABS
5.0000 mg | ORAL_TABLET | Freq: Every evening | ORAL | Status: DC | PRN
Start: 2024-06-22 — End: 2024-06-22

## 2024-06-22 MED ORDER — SIMETHICONE 80 MG PO CHEW: 80.0000 mg | CHEWABLE_TABLET | Freq: Four times a day (QID) | ORAL | Status: DC | PRN

## 2024-06-22 MED ORDER — ONDANSETRON HCL 4 MG/2ML IJ SOLN
INTRAMUSCULAR | Status: DC | PRN
  Administered 2024-06-22: 4 mg via INTRAVENOUS

## 2024-06-22 MED ORDER — PROPOFOL 10 MG/ML IV BOLUS
INTRAVENOUS | Status: AC
Start: 2024-06-22 — End: 2024-06-22
  Filled 2024-06-22: qty 20

## 2024-06-22 SURGICAL SUPPLY — 53 items
BARRIER ADHS 3X4 INTERCEED (GAUZE/BANDAGES/DRESSINGS) IMPLANT
CANNULA CAP OBTURATR AIRSEAL 8 (CAP) ×1 IMPLANT
COVER BACK TABLE 60X90IN (DRAPES) ×1 IMPLANT
COVER TIP SHEARS 8 DVNC (MISCELLANEOUS) ×1 IMPLANT
DEFOGGER SCOPE WARM SEASHARP (MISCELLANEOUS) ×1 IMPLANT
DERMABOND ADVANCED .7 DNX12 (GAUZE/BANDAGES/DRESSINGS) ×1 IMPLANT
DILATOR CANAL MILEX (MISCELLANEOUS) ×1 IMPLANT
DRAPE ARM DVNC X/XI (DISPOSABLE) ×4 IMPLANT
DRAPE COLUMN DVNC XI (DISPOSABLE) ×1 IMPLANT
DRAPE SURG IRRIG POUCH 19X23 (DRAPES) ×1 IMPLANT
DRAPE UTILITY 15X26 TOWEL STRL (DRAPES) ×1 IMPLANT
DRIVER NDL MEGA 8 DVNC XI (INSTRUMENTS) ×1 IMPLANT
DRIVER NDLE MEGA DVNC XI (INSTRUMENTS) ×1 IMPLANT
DURAPREP 26ML APPLICATOR (WOUND CARE) ×1 IMPLANT
ELECTRODE REM PT RTRN 9FT ADLT (ELECTROSURGICAL) ×1 IMPLANT
FORCEPS BPLR LNG DVNC XI (INSTRUMENTS) ×1 IMPLANT
FORCEPS PROGRASP DVNC XI (FORCEP) IMPLANT
GAUZE 4X4 16PLY ~~LOC~~+RFID DBL (SPONGE) IMPLANT
GLOVE BIOGEL M 6.5 STRL (GLOVE) ×3 IMPLANT
GLOVE BIOGEL PI IND STRL 6.5 (GLOVE) ×3 IMPLANT
GRASPER COBRA DVNC RU (INSTRUMENTS) ×1 IMPLANT
HIBICLENS CHG 4% 4OZ BTL (MISCELLANEOUS) ×2 IMPLANT
IRRIGATION STRYKERFLOW (MISCELLANEOUS) ×1 IMPLANT
KIT PINK PAD W/HEAD ARM REST (MISCELLANEOUS) ×1 IMPLANT
LEGGING LITHOTOMY PAIR STRL (DRAPES) ×1 IMPLANT
MANIPULATOR ADVINCU DEL 2.5 PL (MISCELLANEOUS) IMPLANT
MANIPULATOR ADVINCU DEL 3.0 PL (MISCELLANEOUS) IMPLANT
MANIPULATOR ADVINCU DEL 3.5 PL (MISCELLANEOUS) IMPLANT
MANIPULATOR ADVINCU DEL 4.0 PL (MISCELLANEOUS) IMPLANT
OBTURATOR OPTICALSTD 8 DVNC (TROCAR) ×1 IMPLANT
OCCLUDER COLPOPNEUMO (BALLOONS) IMPLANT
PACK ROBOT WH (CUSTOM PROCEDURE TRAY) ×1 IMPLANT
PACK ROBOTIC GOWN (GOWN DISPOSABLE) ×1 IMPLANT
PAD OB MATERNITY 11 LF (PERSONAL CARE ITEMS) ×1 IMPLANT
POWDER SURGICEL 3.0 GRAM (HEMOSTASIS) IMPLANT
RETRACTOR WND ALEXIS 18 MED (MISCELLANEOUS) IMPLANT
SCISSORS LAP 5X45 EPIX DISP (ENDOMECHANICALS) ×1 IMPLANT
SCISSORS MNPLR CVD DVNC XI (INSTRUMENTS) ×1 IMPLANT
SEAL UNIV 5-12 XI (MISCELLANEOUS) ×2 IMPLANT
SEALER VESSEL EXT DVNC XI (MISCELLANEOUS) IMPLANT
SET IRRIG Y TYPE TUR BLADDER L (SET/KITS/TRAYS/PACK) IMPLANT
SET TRI-LUMEN FLTR TB AIRSEAL (TUBING) IMPLANT
SET TUBE FILTERED XL AIRSEAL (SET/KITS/TRAYS/PACK) ×1 IMPLANT
SOLN STERILE WATER BTL 1000 ML (IV SOLUTION) ×1 IMPLANT
SPIKE FLUID TRANSFER (MISCELLANEOUS) ×2 IMPLANT
SUT VIC AB 0 CT1 27XBRD ANBCTR (SUTURE) ×2 IMPLANT
SUT VICRYL 0 UR6 27IN ABS (SUTURE) IMPLANT
SUT VICRYL RAPIDE 4/0 PS 2 (SUTURE) ×2 IMPLANT
SUT VLOC 180 0 9IN GS21 (SUTURE) ×1 IMPLANT
TIP ENDOSCOPIC SURGICEL (TIP) IMPLANT
TOWEL GREEN STERILE (TOWEL DISPOSABLE) ×1 IMPLANT
TROCAR PORT AIRSEAL 8X120 (TROCAR) IMPLANT
UNDERPAD 30X36 HEAVY ABSORB (UNDERPADS AND DIAPERS) ×1 IMPLANT

## 2024-06-22 NOTE — Transfer of Care (Signed)
 Immediate Anesthesia Transfer of Care Note  Patient: Anna Kidd  Procedure(s) Performed: XI ROBOTIC ASSISTED TOTAL HYSTERECTOMY WITH BILATERAL SALPINGECTOMY (Bilateral: Pelvis)  Patient Location: PACU  Anesthesia Type:General  Level of Consciousness: awake, alert , patient cooperative, and responds to stimulation  Airway & Oxygen Therapy: Patient Spontanous Breathing and Patient connected to face mask oxygen  Post-op Assessment: Report given to RN, Post -op Vital signs reviewed and stable, and Patient moving all extremities X 4  Post vital signs: Reviewed and stable  Last Vitals:  Vitals Value Taken Time  BP 107/73 06/22/24 09:24  Temp 36.4 C 06/22/24 09:23  Pulse 100 06/22/24 09:28  Resp 9 06/22/24 09:29  SpO2 100 % 06/22/24 09:28  Vitals shown include unfiled device data.  Last Pain:  Vitals:   06/22/24 0607  TempSrc: Oral  PainSc: 0-No pain      Patients Stated Pain Goal: 6 (06/22/24 9392)  Complications: No notable events documented.

## 2024-06-22 NOTE — Anesthesia Procedure Notes (Addendum)
 Procedure Name: Intubation Date/Time: 06/22/2024 7:48 AM  Performed by: Jolynn Mage, CRNAPre-anesthesia Checklist: Patient identified, Patient being monitored, Timeout performed, Emergency Drugs available and Suction available Patient Re-evaluated:Patient Re-evaluated prior to induction Oxygen Delivery Method: Circle System Utilized Preoxygenation: Pre-oxygenation with 100% oxygen Induction Type: IV induction Ventilation: Mask ventilation without difficulty Laryngoscope Size: Miller and 2 Grade View: Grade I Tube type: Oral Tube size: 7.0 mm Number of attempts: 1 Airway Equipment and Method: Stylet Placement Confirmation: ETT inserted through vocal cords under direct vision, positive ETCO2 and breath sounds checked- equal and bilateral Secured at: 21 cm Tube secured with: Tape Dental Injury: Teeth and Oropharynx as per pre-operative assessment

## 2024-06-22 NOTE — Plan of Care (Signed)

## 2024-06-22 NOTE — H&P (Signed)
 Date of Initial H&P: 06/20/2024   History reviewed, patient examined, no change in status, stable for surgery.

## 2024-06-22 NOTE — Op Note (Signed)
 06/22/2024  9:06 AM  PATIENT:  Anna Kidd  38 y.o. female  PRE-OPERATIVE DIAGNOSIS:  Menorrhagia  POST-OPERATIVE DIAGNOSIS:  Menorrhagia  PROCEDURE:  Procedure(s): XI ROBOTIC ASSISTED TOTAL HYSTERECTOMY WITH BILATERAL SALPINGECTOMY (Bilateral)  SURGEON:  Surgeons and Role:    Anna Rosalva Sawyer, MD - Primary  PHYSICIAN ASSISTANT:   ASSISTANTS: Anna Kidd RNFA An experienced assistant was required given the standard of surgical care given the complexity of the case.  This assistant was needed for exposure, dissection, suctioning, retraction, instrument exchange, assisting with delivery with administration of fundal pressure, and for overall help during the procedure.    ANESTHESIA:   general  EBL:  25 mL   BLOOD ADMINISTERED:none  DRAINS: none   LOCAL MEDICATIONS USED:  OTHER Ropivicaine, marcaine and exparel   SPECIMEN:  Source of Specimen:  uterus cervix and bilateral fallopian tubes   DISPOSITION OF SPECIMEN:  PATHOLOGY  COUNTS:  YES  TOURNIQUET:  * No tourniquets in log *  DICTATION: .Note written in EPIC  PLAN OF CARE: Admit for overnight observation  PATIENT DISPOSITION:  PACU - hemodynamically stable.   Delay start of Pharmacological VTE agent (>24hrs) due to surgical blood loss or risk of bleeding: not applicable  Findings: Normal external genitalia, vaginal mucosa and cervix. Normal appearing uterus fallopian tubes and ovaries.   Procedure: The patient was taken to the operating room #6 where she was placed under general anesthesia. Time out was performed. Anna Kidd She was placed in dorsal lithotomy position and prepped and draped in the usual sterile fashion. A weighted speculum was placed into the vagina. A Deaver was placed anteriorly for retraction. The anterior lip of the cervix was grasped with a single-tooth tenaculum. The vaginal mucosa was injected with 2.5 cc of ropivicaine at the 2/4/ 8 and 10 o'clock positions. The uterus was sounded to 7 cm. the  cervix was dilated to 6 mm . 0 vicryl suture placed at the 12  position of the cervix to facilitate placement of a uterine manipulator. The manipulator was placed without difficulty. Weighted speculum and Deaver were removed .   Attention was turned to the patient's abdomen where a 8 mm trocar was placed at the umbilicus. under direct visualization . The pneumoperitoneum was achieved with PCO2 gas.  An 8 mm trocar was placed in the right upper quadrant 10 centimeters from the umbilicus.later connected to robotic arm #4). An 8 mm incision was made in the left upper quadrant 10 cm from the umbilicus and connected to robot arm #2. ( All incision sites were injected with 10cc of exparel/marcaine prior to port placement. )   Once all ports had been placed under direct visualization.The laparoscope was removed and the [employer] robotic system was then docked. The robotic arms were connected to the corresponding trocars as listed above. The laparoscope was then reinserted. The long tip bipolar forceps were placed into port #2. A vessel sealer was placed in port #4. All instruments were directed into the pelvis under direct visualization.   Attention was turned to the surgeons console.The  left mesosalpinx and utero-ovarian ligament was cauterized and transected with the vessel sealer The broad ligament was cauterized and transected with the vessel sealer .The round ligament was cauterized and transected with the vessel sealer  The anterior leaf of broad ligament was incised along the bladder reflection to the midline.  The right mesosalpinx and  utero-ovarian ligament was cauterized and transected with the vessel sealer. The right broad ligament was cauterized  and transected with the vessel sealer. The right round ligament was cauterized and transected with the vessel sealer The broad ligament was incised to the midline. The bladder was dissected off the lower uterine segments of the cervix via sharp and blunt  dissection.   The uterine arteries were skeleton bilaterally. They were  cauterized and transected with the vessel sealer The KOH ring was identified. The anterior colpotomy was performed followed by the posterior colpotomy. Once the uterus and cervix were completely excised; they  were removed through the vagina. The  bipolar forceps and scissors were removed and cobra forceps were placed in the port #2 and the mega needle driver was placed in to port #4.  The vaginal cuff was closed with running suture if 0 v-lock. The pelvis was irrigated. Excellent hemostasis was noted.  All pelvic pedicles were examined and hemostasis was noted.  All instruments removed from the ports. All ports were removed under direct Visualization. The pneumoperitoneum was released. The skin incisions were closed with 4-0 Vicryl and then covered with Derma bond.     Sponge lap and needle counts weIre correct x 2. The patient was awakened from anesthesia and taken to the recovery room in stable condition.

## 2024-06-22 NOTE — Discharge Summary (Signed)
 Physician Discharge Summary  Patient ID: Anna Kidd MRN: [id number] DOB/AGE: 39-04-1986 39 y.o.  Admit date: 06/22/2024 Discharge date: 06/22/2024  Admission Diagnoses:  Discharge Diagnoses:  Principal Problem:   Menorrhagia Active Problems:   S/P laparoscopic hysterectomy   Discharged Condition: stable  Hospital Course:  Patient was admitted for observation after undergoing an XI ROBOTIC ASSISTED TOTAL HYSTERECTOMY WITH BILATERAL SALPINGECTOMY . She did well postoperatively and is discharged home on postop day #0 in stable condition. She is  tolerating po, urinating, and ambulating prior to discharge. Pain is weill controlled with oral pain medications.   Consults: None  Significant Diagnostic Studies: labs:  Recent Results (from the past 2160 hours)  Type and screen     Status: None   Collection Time: 06/21/24  9:15 AM  Result Value Ref Range   ABO/RH(D) A POS    Antibody Screen NEG    Sample Expiration 07/05/2024,2359    Extend sample reason      NO TRANSFUSIONS OR PREGNANCY IN THE PAST 3 MONTHS Performed at [facility], [address], [place], [place] [address]   CBC     Status: Abnormal   Collection Time: 06/21/24  9:27 AM  Result Value Ref Range   WBC 4.8 4.0 - 10.5 K/uL   RBC 5.46 (H) 3.87 - 5.11 MIL/uL   Hemoglobin 12.3 12.0 - 15.0 g/dL   HCT 61.0 63.9 - 53.9 %   MCV 71.2 (L) 80.0 - 100.0 fL   MCH 22.5 (L) 26.0 - 34.0 pg   MCHC 31.6 30.0 - 36.0 g/dL   RDW 86.2 88.4 - 84.4 %   Platelets 264 150 - 400 K/uL   nRBC 0.0 0.0 - 0.2 %    Comment: Performed at [facility], [address], [place], [place] [address]  Pregnancy, urine POC     Status: None   Collection Time: 06/22/24  6:03 AM  Result Value Ref Range   Preg Test, Ur NEGATIVE NEGATIVE    Comment:        THE SENSITIVITY OF THIS METHODOLOGY IS >20 mIU/mL.   ABO/Rh     Status: None   Collection Time: 06/22/24  6:21 AM  Result Value Ref Range   ABO/RH(D)      A POS Performed at  [facility], [address] [place], [place] [address]   Surgical pathology     Status: None   Collection Time: 06/22/24  8:42 AM  Result Value Ref Range   SURGICAL PATHOLOGY      SURGICAL PATHOLOGY CASE: [id number] PATIENT: Anna Kidd Surgical Pathology Report     Clinical History: menorrhagia (cm)     FINAL MICROSCOPIC DIAGNOSIS:  A. UTERUS AND CERVIX, WITH BILATERAL FALLOPIAN TUBES, HYSTERECTOMY: - Secretory endometrium. - Myometrium with no specific pathologic change. - Cervix with focal chronic endocervicitis. - Bilateral fallopian tubes with no specific pathologic change.   GROSS DESCRIPTION:  Specimen: Received fresh is uterus, cervix, bilateral fallopian tubes Specimen integrity: Intact Size and shape: 5.4 x 5.2 x 3.7 cm within normal anatomic shape Weight: 66 g Serosa: Pink-tan, smooth, and intact Cervix: 2.8 cm in length by 3.1 cm in diameter with a 1.1 cm slit shaped external os.  The ectocervical mucosa is pink-tan, smooth, and glistening.  The stroma is minimally roughened, but otherwise intact. Sectioning reveals an underlying pink-tan cut surface, without grossly distinct lesions. Endometrium: Pink-tan and  glistening, without distinct lesions, and with a thickness of 0.1 cm.  Myometrium: The myometrium is pink-tan, without distinct lesions, and with a thickness of 2.0 cm. Right adnexa: The right fallopian tube measures 5.8 cm in length by 0.7 cm in diameter and consists of a purple-pink, intact external surface that includes attached fimbria.  Sectioning reveals a patent lumen, without distinct lesions. Left adnexa: The left fallopian tube measures 5.7 cm in length by 0.7 cm in diameter and consists of a purple-pink, intact external surface that includes attached fimbria.  Sectioning reveals a patent lumen, without distinct lesions. Block Summary: A1 anterior cervix A2 anterior lower uterine segment A3 full-thickness anterior  endomyometrium A4 posterior cervix A5 posterior lower uterine segment A6 full-thickness posterior endomyometrium A7 right tube A8 left tube Anna Kidd, 06/22/2024)   Final Diagnosis performed by Wally Highland, MD.   Electronically signed 06/23/2024  Technical and / or Professional components performed at [facility]. [facility], [address] [place], [place] [address].  Immunohistochemistry Technical component (if applicable) was performed at [facility]. [address], [place], [place] [address].   IMMUNOHISTOCHEMISTRY DISCLAIMER (if applicable): Some of these immunohistochemical stains may have been developed and the performance characteristics determine by [facility]. Some may not have been cleared or approved by the U.S. Food and Drug Administration. The FDA has determined that such clearance or approval is not necessary. This test is used for clinical purposes. It should not be regarded as investigational or for research. This laboratory is certified under the Clinical Laboratory Improvement Amendments of 1988 (CLIA-88) as qualified to perform high complexity clinical laboratory testing.  The controls stained appropriately.   IHC stains are performed on forma lin fixed, paraffin embedded tissue using a 3,3diaminobenzidine (DAB) chromogen and Leica Bond Autostainer System. The staining intensity of the nucleus is score manually and is reported as the percentage of tumor cell nuclei demonstrating specific nuclear staining. The specimens are fixed in 10% Neutral Formalin for at least 6 hours and up to 72hrs. These tests are validated on decalcified tissue. Results should be interpreted with caution given the possibility of false negative results on decalcified specimens. Antibody Clones are as follows ER-clone 29F, PR-clone 16, Ki67- clone MM1. Some of these immunohistochemical stains may have been developed and the  performance characteristics determined by [facility].      Treatments: surgery: XI ROBOTIC ASSISTED TOTAL HYSTERECTOMY WITH BILATERAL SALPINGECTOMY   Discharge Exam: Blood pressure 113/73, pulse 93, temperature 98.7 F (37.1 C), temperature source Oral, resp. rate 17, height 5' 5 (1.651 m), weight 77.1 kg, last menstrual period 05/17/2024, SpO2 100%. General appearance: alert, cooperative, and no distress Resp: no distress  GI: soft appropriately tender non-distended. No rebound no guarding. Hypoactive bowel sounds  Extremities: extremities normal, atraumatic, no cyanosis or edema Incision/Wound: well approximated no erythema or exudate   Disposition: Discharge disposition: 01-Home or Self Care       Discharge Instructions     Call MD for:  persistant nausea and vomiting   Complete by: As directed    Call MD for:  redness, tenderness, or signs of infection (pain, swelling, redness, odor or green/yellow discharge around incision site)   Complete by: As directed    Call MD for:  severe uncontrolled pain   Complete by: As directed    Call MD for:  temperature >100.4   Complete by: As directed    Diet general   Complete by: As directed    Driving Restrictions   Complete by: As directed    Avoid  driving for 1 week   Increase activity slowly   Complete by: As directed    Lifting restrictions   Complete by: As directed    Avoid lifting over 10 lbs for 6 weeks and until approved by Dr. Rosalva   May shower / Bathe   Complete by: As directed    May walk up steps   Complete by: As directed    No wound care   Complete by: As directed    Sexual Activity Restrictions   Complete by: As directed    Avoid sex for 6 weeks and until approved by Dr. Rosalva      Allergies as of 06/22/2024       Reactions   Aimovig [erenumab-aooe] Hives   Doxycycline Nausea And Vomiting   GI upset/ abd pain        Medication List     TAKE these medications    acetaminophen  500  MG tablet Commonly known as: TYLENOL  Take 2 tablets (1,000 mg total) by mouth every 8 (eight) hours as needed for mild pain (pain score 1-3).   Albuterol-Budesonide 90-80 MCG/ACT Aero Inhale 2 puffs into the lungs every 6 (six) hours as needed.   Asmanex HFA 100 MCG/ACT Aero Generic drug: Mometasone Furoate Inhale 2 puffs into the lungs 2 (two) times daily.   Emgality 120 MG/ML Soaj Generic drug: Galcanezumab-gnlm Inject into the skin every 30 (thirty) days.   escitalopram 20 MG tablet Commonly known as: LEXAPRO Take 20 mg by mouth daily.   Excedrin Migraine 250-250-65 MG tablet Generic drug: aspirin-acetaminophen -caffeine Take 1-2 tablets by mouth every 6 (six) hours as needed for headache or migraine.   ferrous sulfate 325 (65 FE) MG tablet Take 325 mg by mouth daily with breakfast.   fexofenadine 180 MG tablet Commonly known as: ALLEGRA Take 180 mg by mouth daily as needed for allergies or rhinitis.   ibuprofen 600 MG tablet Commonly known as: ADVIL Take 1 tablet (600 mg total) by mouth every 6 (six) hours as needed for mild pain (pain score 1-3).   Linzess 145 MCG Caps capsule Generic drug: linaclotide Take 145 mcg by mouth daily as needed.   MAGNESIUM PO Take 1 capsule by mouth daily.   norethindrone 0.35 MG tablet Commonly known as: MICRONOR Take 1 tablet by mouth daily.   oxyCODONE 5 MG immediate release tablet Commonly known as: Oxy IR/ROXICODONE Take 1-2 tablets (5-10 mg total) by mouth every 6 (six) hours as needed for moderate pain (pain score 4-6).   ProAir Digihaler 108 (90 Base) MCG/ACT Aepb Generic drug: Albuterol Sulfate (sensor) Inhale 1 puff into the lungs every 4 (four) hours as needed.   Qsymia 11.25-69 MG Cp24 Generic drug: Phentermine-Topiramate ER Take 1 tablet by mouth daily.   Vitamin D-3 25 MCG (1000 UT) Caps Take 1 capsule by mouth daily.        Follow-up Information     Rosalva Sawyer, MD. Go in 2 week(s).   Specialty:  Obstetrics and Gynecology Contact information: [address] [place] [address] [phone]                 Signed: Sawyer Rosalva 06/22/2024, 4:04 PM

## 2024-06-22 NOTE — Anesthesia Preprocedure Evaluation (Signed)
 Anesthesia Evaluation  Patient identified by MRN, date of birth, ID band Patient awake    Reviewed: Allergy & Precautions, NPO status , Patient's Chart, lab work & pertinent test results  Airway Mallampati: II  TM Distance: >3 FB Neck ROM: Full    Dental  (+) Dental Advisory Given   Pulmonary asthma    breath sounds clear to auscultation       Cardiovascular negative cardio ROS  Rhythm:Regular Rate:Normal     Neuro/Psych  Headaches    GI/Hepatic negative GI ROS, Neg liver ROS,,,  Endo/Other  negative endocrine ROS    Renal/GU negative Renal ROS     Musculoskeletal   Abdominal   Peds  Hematology  (+) Blood dyscrasia, Sickle cell trait   Anesthesia Other Findings   Reproductive/Obstetrics                              Anesthesia Physical Anesthesia Plan  ASA: 2  Anesthesia Plan: General   Post-op Pain Management: Tylenol  PO (pre-op)*, Gabapentin PO (pre-op)* and Toradol  IV (intra-op)*   Induction: Intravenous  PONV Risk Score and Plan: 4 or greater and Dexamethasone, Ondansetron , Midazolam, Scopolamine patch - Pre-op and Treatment may vary due to age or medical condition  Airway Management Planned: Oral ETT  Additional Equipment:   Intra-op Plan:   Post-operative Plan: Extubation in OR  Informed Consent: I have reviewed the patients History and Physical, chart, labs and discussed the procedure including the risks, benefits and alternatives for the proposed anesthesia with the patient or authorized representative who has indicated his/her understanding and acceptance.     Dental advisory given  Plan Discussed with: CRNA  Anesthesia Plan Comments:         Anesthesia Quick Evaluation

## 2024-06-23 ENCOUNTER — Encounter (HOSPITAL_COMMUNITY): Payer: Self-pay | Admitting: Obstetrics and Gynecology

## 2024-06-23 LAB — SURGICAL PATHOLOGY

## 2024-06-23 NOTE — Anesthesia Postprocedure Evaluation (Signed)
 Anesthesia Post Note  Patient: Anna Kidd  Procedure(s) Performed: XI ROBOTIC ASSISTED TOTAL HYSTERECTOMY WITH BILATERAL SALPINGECTOMY (Bilateral: Pelvis)     Patient location during evaluation: PACU Anesthesia Type: General Level of consciousness: awake and alert Pain management: pain level controlled Vital Signs Assessment: post-procedure vital signs reviewed and stable Respiratory status: spontaneous breathing, nonlabored ventilation, respiratory function stable and patient connected to nasal cannula oxygen Cardiovascular status: blood pressure returned to baseline and stable Postop Assessment: no apparent nausea or vomiting Anesthetic complications: no   No notable events documented.  Last Vitals:  Vitals:   06/22/24 1215 06/22/24 1316  BP: 116/72 113/73  Pulse: 99 93  Resp: 18 17  Temp: 36.8 C 37.1 C  SpO2: 100%     Last Pain:  Vitals:   06/22/24 1550  TempSrc:   PainSc: 6                  Epifanio Lamar BRAVO

## 2024-09-27 ENCOUNTER — Encounter: Payer: Self-pay | Admitting: Emergency Medicine

## 2024-09-27 ENCOUNTER — Ambulatory Visit
Admission: EM | Admit: 2024-09-27 | Discharge: 2024-09-27 | Disposition: A | Discharge status: Home / Self Care | Attending: Emergency Medicine | Admitting: Emergency Medicine

## 2024-09-27 DIAGNOSIS — N898 Other specified noninflammatory disorders of vagina: Secondary | ICD-10-CM | POA: Insufficient documentation

## 2024-09-27 DIAGNOSIS — Z113 Encounter for screening for infections with a predominantly sexual mode of transmission: Secondary | ICD-10-CM | POA: Diagnosis not present

## 2024-09-27 MED ORDER — MICONAZOLE NITRATE 2 % EX CREA: 1.0000 | TOPICAL_CREAM | Freq: Two times a day (BID) | CUTANEOUS | 0 refills | Status: AC

## 2024-09-27 MED ORDER — FLUCONAZOLE 150 MG PO TABS: 150.0000 mg | ORAL_TABLET | Freq: Once | ORAL | 1 refills | Status: AC

## 2024-09-27 NOTE — ED Provider Notes (Signed)
 HPI  SUBJECTIVE:  Anna Kidd is a 39 y.o. female who presents with 2 days of nonodorous white thin/thick vaginal discharge and itching.  No urinary complaints, no genital rash, abnormal vaginal bleeding, odor, abdominal, back, pelvic pain.  She is sexually active with a female, who is asymptomatic.  She does not have any other partners, but is not sure about him.  She would like to be tested for STDs.  She was treated for BV with Flagyl 2 weeks ago.  She states this discharge is different than her usual discharge with BV.  She tried 1 day Monistat  x 1 with worsening of her symptoms.  She has no history of STDs, diabetes.  She has a history of IBS, BV and yeast.  LMP: Status post hysterectomy.  PCP: [facility].   Past Medical History:  Diagnosis Date   Anxiety    Asthma    Iron deficiency anemia due to chronic blood loss    Irritable bowel syndrome with constipation    followed by [facility]   Menorrhagia    Migraine with aura and without status migrainosus, not intractable    Seasonal allergies    Sickle cell trait    Vitamin D deficiency    Wears glasses     Past Surgical History:  Procedure Laterality Date   LIPOSUCTION  2018   [employer] in [place]   NO PAST SURGERIES     ROBOTIC ASSISTED TOTAL HYSTERECTOMY WITH BILATERAL SALPINGO OOPHERECTOMY Bilateral 06/22/2024   Procedure: XI ROBOTIC ASSISTED TOTAL HYSTERECTOMY WITH BILATERAL SALPINGECTOMY;  Surgeon: Rosalva Sawyer, MD;  Location: [facility] OR;  Service: Gynecology;  Laterality: Bilateral;    Family History  Problem Relation Age of Onset   Hypertension Mother    Asthma Mother    Hypertension Father    Asthma Father    Asthma Son    Hypertension Maternal Grandmother    Hypertension Maternal Grandfather    Hypertension Paternal Grandmother    Hypertension Paternal Grandfather    Diabetes Paternal Grandmother     Social History[1]  Current Medications[2]  Allergies[3]   ROS  As noted in HPI.   Physical  Exam  BP 104/69 (BP Location: Right Arm)   Pulse 90   Temp 97.9 F (36.6 C) (Oral)   Resp 16   Wt 72.1 kg   LMP 05/17/2024   SpO2 98%   BMI 26.46 kg/m   Constitutional: Well developed, well nourished, no acute distress Eyes:  EOMI, conjunctiva normal bilaterally HENT: Normocephalic, atraumatic,mucus membranes moist Respiratory: Normal inspiratory effort Cardiovascular: Normal rate GI: nondistended soft, nontender. No suprapubic, flank, lower abdominal tenderness  back: No CVA tenderness GU: Deferred skin: No rash, skin intact Musculoskeletal: no deformities Neurologic: Alert & oriented x 3, no focal neuro deficits Psychiatric: Speech and behavior appropriate   ED Course   Medications - No data to display  Orders Placed This Encounter  Procedures   RPR    Standing Status:   Standing    Number of Occurrences:   1   HIV Antibody (routine testing w rflx)    Standing Status:   Standing    Number of Occurrences:   1    No results found for this or any previous visit (from the past 24 hours). No results found.  ED Clinical Impression  1. Vaginal discharge   2. Screening for STDs (sexually transmitted diseases)      ED Assessment/Plan    H&P most c/w yeast infection. Sent off  swab for STDs, BV, yeast, HIV, RPR. Will not treat empirically now.Will send home with diflucan  and miconazole  cream for yeast infection.  Will tailor further therapy based on labs.  Advised pt to refrain from sexual contact until all lab results are back, symptoms resolve, and partner(s) are treated if necessary. Pt provided working phone number. Follow-up with PCP as needed. Discussed labs, MDM, plan and followup with patient. Pt agrees with plan.   Meds ordered this encounter  Medications   fluconazole  (DIFLUCAN ) 150 MG tablet    Sig: Take 1 tablet (150 mg total) by mouth once for 1 dose. 1 tab po x 1. May repeat in 72 hours if no improvement    Dispense:  2 tablet    Refill:  1    miconazole  (MICOTIN) 2 % cream    Sig: Apply 1 Application topically 2 (two) times daily.    Dispense:  28.35 g    Refill:  0    *This clinic note was created using Scientist, clinical (histocompatibility and immunogenetics). Therefore, there may be occasional mistakes despite careful proofreading.  ?       [1]  Social History Tobacco Use   Smoking status: Never   Smokeless tobacco: Never  Vaping Use   Vaping status: Never Used  Substance Use Topics   Alcohol use: No   Drug use: Never  [2] No current facility-administered medications for this encounter.  Current Outpatient Medications:    fluconazole  (DIFLUCAN ) 150 MG tablet, Take 1 tablet (150 mg total) by mouth once for 1 dose. 1 tab po x 1. May repeat in 72 hours if no improvement, Disp: 2 tablet, Rfl: 1   miconazole  (MICOTIN) 2 % cream, Apply 1 Application topically 2 (two) times daily., Disp: 28.35 g, Rfl: 0   acetaminophen  (TYLENOL ) 500 MG tablet, Take 2 tablets (1,000 mg total) by mouth every 8 (eight) hours as needed for mild pain (pain score 1-3)., Disp: 30 tablet, Rfl: 0   Albuterol  Sulfate, sensor, (PROAIR  DIGIHALER) 108 (90 Base) MCG/ACT AEPB, Inhale 1 puff into the lungs every 4 (four) hours as needed., Disp: , Rfl:    Albuterol -Budesonide 90-80 MCG/ACT AERO, Inhale 2 puffs into the lungs every 6 (six) hours as needed., Disp: , Rfl:    aspirin-acetaminophen -caffeine (EXCEDRIN MIGRAINE) 250-250-65 MG tablet, Take 1-2 tablets by mouth every 6 (six) hours as needed for headache or migraine., Disp: , Rfl:    Cholecalciferol (VITAMIN D-3) 25 MCG (1000 UT) CAPS, Take 1 capsule by mouth daily., Disp: , Rfl:    escitalopram (LEXAPRO) 20 MG tablet, Take 20 mg by mouth daily., Disp: , Rfl:    ferrous sulfate 325 (65 FE) MG tablet, Take 325 mg by mouth daily with breakfast., Disp: , Rfl:    fexofenadine (ALLEGRA) 180 MG tablet, Take 180 mg by mouth daily as needed for allergies or rhinitis., Disp: , Rfl:    Galcanezumab-gnlm (EMGALITY) 120 MG/ML SOAJ, Inject  into the skin every 30 (thirty) days., Disp: , Rfl:    ibuprofen  (ADVIL ) 600 MG tablet, Take 1 tablet (600 mg total) by mouth every 6 (six) hours as needed for mild pain (pain score 1-3)., Disp: 30 tablet, Rfl: 1   LINZESS 145 MCG CAPS capsule, Take 145 mcg by mouth daily as needed., Disp: , Rfl:    MAGNESIUM PO, Take 1 capsule by mouth daily., Disp: , Rfl:    Mometasone Furoate (ASMANEX HFA) 100 MCG/ACT AERO, Inhale 2 puffs into the lungs 2 (two) times daily., Disp: , Rfl:  norethindrone (MICRONOR) 0.35 MG tablet, Take 1 tablet by mouth daily., Disp: , Rfl:    oxyCODONE  (OXY IR/ROXICODONE ) 5 MG immediate release tablet, Take 1-2 tablets (5-10 mg total) by mouth every 6 (six) hours as needed for moderate pain (pain score 4-6)., Disp: 20 tablet, Rfl: 0   Phentermine-Topiramate ER (QSYMIA) 11.25-69 MG CP24, Take 1 tablet by mouth daily., Disp: , Rfl:  [3]  Allergies Allergen Reactions   Aimovig [Erenumab-Aooe] Hives   Doxycycline Nausea And Vomiting    GI upset/ abd pain     Van Knee, MD 09/28/24 1043

## 2024-09-27 NOTE — Discharge Instructions (Signed)
 We are sending off testing for gonorrhea, chlamydia, HIV, syphilis, trichomonas, bacterial vaginosis and yeast.  I am treating you for yeast.  The Diflucan  will take care of the infection and the miconazole  will help with soothing things as well as taking care of an infection . take the medication as written. Give us  a working phone number so that we can contact you if needed. Refrain from sexual contact until all of your labs have come back, symptoms have resolved, and your partner(s) are treated if necessary. Return to the ER if you get worse, have a fever >100.4, or for any concerns.   Go to www.goodrx.com  or www.costplusdrugs.com to look up your medications. This will give you a list of where you can find your prescriptions at the most affordable prices. Or ask the pharmacist what the cash price is, or if they have any other discount programs available to help make your medication more affordable. This can be less expensive than what you would pay with insurance.

## 2024-09-27 NOTE — ED Triage Notes (Signed)
 Pt presents with vaginal itching and discharge x 3 days. Pt has tried 1 dose of monistat  with no relief. Pt would like STD testing as well.

## 2024-09-28 LAB — CERVICOVAGINAL ANCILLARY ONLY
Bacterial Vaginitis (gardnerella): NEGATIVE
Candida Glabrata: NEGATIVE
Candida Vaginitis: NEGATIVE
Chlamydia: NEGATIVE
Comment: NEGATIVE
Comment: NEGATIVE
Comment: NEGATIVE
Comment: NEGATIVE
Comment: NEGATIVE
Comment: NORMAL
Neisseria Gonorrhea: NEGATIVE
Trichomonas: NEGATIVE

## 2024-09-28 LAB — HIV ANTIBODY (ROUTINE TESTING W REFLEX): HIV Screen 4th Generation wRfx: NONREACTIVE

## 2024-09-28 LAB — SYPHILIS: RPR W/REFLEX TO RPR TITER AND TREPONEMAL ANTIBODIES, TRADITIONAL SCREENING AND DIAGNOSIS ALGORITHM: RPR Ser Ql: NONREACTIVE
